# Patient Record
Sex: Male | Born: 1950
Health system: Southern US, Community
[De-identification: ages and names within clinical notes are randomized; demographics above are authoritative.]

## PROBLEM LIST (undated history)

## (undated) DIAGNOSIS — Z9109 Other allergy status, other than to drugs and biological substances: Secondary | ICD-10-CM

## (undated) DIAGNOSIS — R200 Anesthesia of skin: Secondary | ICD-10-CM

## (undated) DIAGNOSIS — K439 Ventral hernia without obstruction or gangrene: Secondary | ICD-10-CM

## (undated) DIAGNOSIS — I872 Venous insufficiency (chronic) (peripheral): Secondary | ICD-10-CM

## (undated) DIAGNOSIS — N400 Enlarged prostate without lower urinary tract symptoms: Secondary | ICD-10-CM

## (undated) DIAGNOSIS — M549 Dorsalgia, unspecified: Secondary | ICD-10-CM

## (undated) DIAGNOSIS — G8929 Other chronic pain: Secondary | ICD-10-CM

## (undated) DIAGNOSIS — M419 Scoliosis, unspecified: Secondary | ICD-10-CM

## (undated) DIAGNOSIS — K219 Gastro-esophageal reflux disease without esophagitis: Secondary | ICD-10-CM

## (undated) DIAGNOSIS — A159 Respiratory tuberculosis unspecified: Secondary | ICD-10-CM

## (undated) DIAGNOSIS — Z973 Presence of spectacles and contact lenses: Secondary | ICD-10-CM

## (undated) DIAGNOSIS — M1990 Unspecified osteoarthritis, unspecified site: Secondary | ICD-10-CM

## (undated) HISTORY — PX: EYE SURGERY: SHX253

## (undated) HISTORY — PX: HERNIA REPAIR: SHX51

---

## 2003-02-21 ENCOUNTER — Ambulatory Visit (HOSPITAL_BASED_OUTPATIENT_CLINIC_OR_DEPARTMENT_OTHER): Admission: RE | Admit: 2003-02-21 | Discharge: 2003-02-21 | Payer: Self-pay | Admitting: Surgery

## 2011-11-13 ENCOUNTER — Emergency Department: Payer: Self-pay | Admitting: *Deleted

## 2011-11-13 LAB — URINALYSIS, COMPLETE
Glucose,UR: NEGATIVE mg/dL (ref 0–75)
Ketone: NEGATIVE
Leukocyte Esterase: NEGATIVE
Nitrite: NEGATIVE
RBC,UR: 333 /HPF (ref 0–5)
Specific Gravity: 1.005 (ref 1.003–1.030)
WBC UR: NONE SEEN /HPF (ref 0–5)

## 2013-12-22 ENCOUNTER — Ambulatory Visit (INDEPENDENT_AMBULATORY_CARE_PROVIDER_SITE_OTHER): Payer: No Typology Code available for payment source | Admitting: Family Medicine

## 2013-12-22 VITALS — BP 122/84 | HR 73 | Temp 98.6°F | Resp 16 | Ht 71.5 in | Wt 182.0 lb

## 2013-12-22 DIAGNOSIS — S20229A Contusion of unspecified back wall of thorax, initial encounter: Secondary | ICD-10-CM

## 2013-12-22 MED ORDER — NAPROXEN 500 MG PO TABS: 500.0000 mg | ORAL_TABLET | Freq: Two times a day (BID) | ORAL | Status: DC

## 2013-12-22 NOTE — Progress Notes (Signed)
   Subjective:    Patient ID: Jordan Nelson, male    DOB: 12/06/1950, 63 y.o.   MRN: [id number]  HPI Patient presents with acute low back pain. Was soaking feet in tub and fell asleep and fell backwards against toilet and strained back. Pain currently on left side of lower back. No radicular pain or numbness. No hematuria. History of mild scoliosis but no other back problems. Tried a cold towel but no other treatment. No leg weakness. No B/B symptoms. No pleuritic pain  Review of Systems  All other systems reviewed and are negative.      Objective:   Physical Exam  Constitutional: He is oriented to person, place, and time. He appears well-developed and well-nourished. No distress.  HENT:  Head: Normocephalic and atraumatic.  Eyes: Conjunctivae are normal. Pupils are equal, round, and reactive to light. No scleral icterus.  Cardiovascular: Normal rate.   Pulmonary/Chest: Effort normal.  Musculoskeletal: Normal range of motion.  Neurological: He is alert and oriented to person, place, and time.  Skin: Skin is warm. He is not diaphoretic.  Psychiatric: He has a normal mood and affect. His behavior is normal.  Low back: FROM in flex, ext, lat bend, rotation. Scoliosis. Contusion over left flank. No midline TTP. No pain with deep breath. Normal 5/5 strength BLE. Reflexes trace BLE.     Assessment & Plan:  #1. Low back contusion/strain - Gentle ROM - naproxen bid x 2 weeks then prn - ice/heat - return precautions

## 2013-12-22 NOTE — Patient Instructions (Signed)
Thank you for coming in today  1. You have bruised and strained your back 2. I think that this will get better over several weeks 3. Use ice and heat for 10-15 minutes at a time. Use whatever feels better 4. Work on gentle stretching and activity 5. Call if you get worsening pain or blood in urine 6. Take naproxen twice a day for 2 weeks then as needed  Back Pain, Adult Low back pain is very common. About 1 in 5 people have back pain.The cause of low back pain is rarely dangerous. The pain often gets better over time.About half of people with a sudden onset of back pain feel better in just 2 weeks. About 8 in 10 people feel better by 6 weeks.  CAUSES Some common causes of back pain include:  Strain of the muscles or ligaments supporting the spine.  Wear and tear (degeneration) of the spinal discs.  Arthritis.  Direct injury to the back. DIAGNOSIS Most of the time, the direct cause of low back pain is not known.However, back pain can be treated effectively even when the exact cause of the pain is unknown.Answering your caregiver's questions about your overall health and symptoms is one of the most accurate ways to make sure the cause of your pain is not dangerous. If your caregiver needs more information, he or she may order lab work or imaging tests (X-rays or MRIs).However, even if imaging tests show changes in your back, this usually does not require surgery. HOME CARE INSTRUCTIONS For many people, back pain returns.Since low back pain is rarely dangerous, it is often a condition that people can learn to [facility] their own.   Remain active. It is stressful on the back to sit or stand in one place. Do not sit, drive, or stand in one place for more than 30 minutes at a time. Take short walks on level surfaces as soon as pain allows.Try to increase the length of time you walk each day.  Do not stay in bed.Resting more than 1 or 2 days can delay your recovery.  Do not avoid  exercise or work.Your body is made to move.It is not dangerous to be active, even though your back may hurt.Your back will likely heal faster if you return to being active before your pain is gone.  Pay attention to your body when you bend and lift. Many people have less discomfortwhen lifting if they bend their knees, keep the load close to their bodies,and avoid twisting. Often, the most comfortable positions are those that put less stress on your recovering back.  Find a comfortable position to sleep. Use a firm mattress and lie on your side with your knees slightly bent. If you lie on your back, put a pillow under your knees.  Only take over-the-counter or prescription medicines as directed by your caregiver. Over-the-counter medicines to reduce pain and inflammation are often the most helpful.Your caregiver may prescribe muscle relaxant drugs.These medicines help dull your pain so you can more quickly return to your normal activities and healthy exercise.  Put ice on the injured area.  Put ice in a plastic bag.  Place a towel between your skin and the bag.  Leave the ice on for 15-20 minutes, 03-04 times a day for the first 2 to 3 days. After that, ice and heat may be alternated to reduce pain and spasms.  Ask your caregiver about trying back exercises and gentle massage. This may be of some benefit.  Avoid  feeling anxious or stressed.Stress increases muscle tension and can worsen back pain.It is important to recognize when you are anxious or stressed and learn ways to manage it.Exercise is a great option. SEEK MEDICAL CARE IF:  You have pain that is not relieved with rest or medicine.  You have pain that does not improve in 1 week.  You have new symptoms.  You are generally not feeling well. SEEK IMMEDIATE MEDICAL CARE IF:   You have pain that radiates from your back into your legs.  You develop new bowel or bladder control problems.  You have unusual weakness or  numbness in your arms or legs.  You develop nausea or vomiting.  You develop abdominal pain.  You feel faint. Document Released: 07/29/2005 Document Revised: 01/28/2012 Document Reviewed: 12/17/2010 [facility] Patient Information 2014 [place].

## 2014-02-05 ENCOUNTER — Telehealth: Payer: Self-pay

## 2014-02-05 NOTE — Telephone Encounter (Signed)
Patient called wanting to know if Jordan SickleKatina had left a form for him in the pick up drawer. The form is ready and patient requests it to be mailed. Will mail form. Address correct in system.

## 2017-01-23 ENCOUNTER — Emergency Department (HOSPITAL_COMMUNITY): Payer: Medicare Other

## 2017-01-23 ENCOUNTER — Other Ambulatory Visit: Payer: Self-pay

## 2017-01-23 ENCOUNTER — Encounter (HOSPITAL_COMMUNITY): Payer: Self-pay | Admitting: *Deleted

## 2017-01-23 DIAGNOSIS — R0789 Other chest pain: Secondary | ICD-10-CM | POA: Insufficient documentation

## 2017-01-23 DIAGNOSIS — G8929 Other chronic pain: Secondary | ICD-10-CM | POA: Diagnosis not present

## 2017-01-23 DIAGNOSIS — M545 Low back pain: Secondary | ICD-10-CM | POA: Diagnosis not present

## 2017-01-23 DIAGNOSIS — F17200 Nicotine dependence, unspecified, uncomplicated: Secondary | ICD-10-CM | POA: Diagnosis not present

## 2017-01-23 NOTE — ED Triage Notes (Signed)
The pt is c/o lt mid-chest pain for 2 days especially when he presses down with his lt hand  No son no other problem

## 2017-01-24 ENCOUNTER — Emergency Department (HOSPITAL_COMMUNITY)
Admission: EM | Admit: 2017-01-24 | Discharge: 2017-01-24 | Disposition: A | Discharge status: Home / Self Care | Payer: Medicare Other | Attending: Emergency Medicine | Admitting: Emergency Medicine

## 2017-01-24 DIAGNOSIS — G8929 Other chronic pain: Secondary | ICD-10-CM

## 2017-01-24 DIAGNOSIS — M545 Low back pain: Secondary | ICD-10-CM

## 2017-01-24 DIAGNOSIS — R0789 Other chest pain: Secondary | ICD-10-CM

## 2017-01-24 LAB — BASIC METABOLIC PANEL
ANION GAP: 6 (ref 5–15)
BUN: 10 mg/dL (ref 6–20)
CALCIUM: 9.1 mg/dL (ref 8.9–10.3)
CO2: 25 mmol/L (ref 22–32)
CREATININE: 0.95 mg/dL (ref 0.61–1.24)
Chloride: 106 mmol/L (ref 101–111)
Glucose, Bld: 92 mg/dL (ref 65–99)
Potassium: 3.6 mmol/L (ref 3.5–5.1)
SODIUM: 137 mmol/L (ref 135–145)

## 2017-01-24 LAB — CBC
HCT: 37.1 % — ABNORMAL LOW (ref 39.0–52.0)
Hemoglobin: 12 g/dL — ABNORMAL LOW (ref 13.0–17.0)
MCH: 31 pg (ref 26.0–34.0)
MCHC: 32.3 g/dL (ref 30.0–36.0)
MCV: 95.9 fL (ref 78.0–100.0)
PLATELETS: 154 10*3/uL (ref 150–400)
RBC: 3.87 MIL/uL — ABNORMAL LOW (ref 4.22–5.81)
RDW: 13.2 % (ref 11.5–15.5)
WBC: 3.5 10*3/uL — ABNORMAL LOW (ref 4.0–10.5)

## 2017-01-24 LAB — TROPONIN I

## 2017-01-24 NOTE — Discharge Instructions (Signed)
Take 4 over the counter ibuprofen tablets 3 times a day or 2 over-the-counter naproxen tablets twice a day for pain. Also take tylenol 1000mg (2 extra strength) four times a day.    Discuss with [facility].

## 2017-01-24 NOTE — ED Provider Notes (Signed)
[facility]EM[facility] DEPT Provider Note   CSN: [id number] Arrival date & time: 01/23/17  2316  By signing my name below, I, Rosana Fret, attest that this documentation has been prepared under the direction and in the presence of Melene Plan, DO. Electronically Signed: Rosana Fret, ED Scribe. 01/24/17. 1:40 AM.  History   Chief Complaint Chief Complaint  Patient presents with  . Chest Pain   The history is provided by the patient. No language interpreter was used.  Chest Pain   Pertinent negatives include no abdominal pain, no diaphoresis, no fever, no headaches, no palpitations, no shortness of breath and no vomiting.   HPI Comments: Jordan Nelson is a 66 y.o. male who presents to the Emergency Department complaining of moderate, left-sided chest pain onset 2 days ago. Pt describes pain as a tightness sensation that occurs when he uses his left arm. Pt states pain is exacerbated by direct pressure and palpation. Pt reports pain is not worse with exertion. Per pt, he is a smoker. No FHx of CAD. No h/o CAD, HTN, HLD, DM PE/DVT, recent long travel, surgery, fracture, prolonged immobilization, or exogenous testosterone usage. Pt denies SOB, diaphoresis, vomiting or any other complaints at this time.  History reviewed. No pertinent past medical history.  There are no active problems to display for this patient.   History reviewed. No pertinent surgical history.     Home Medications    Prior to Admission medications   Medication Sig Start Date End Date Taking? Authorizing Provider  loratadine (CLARITIN) 10 MG tablet Take 10 mg by mouth daily.   Yes [provider]  naproxen (NAPROSYN) 500 MG tablet Take 1 tablet (500 mg total) by mouth 2 (two) times daily with a meal. Patient not taking: Reported on 01/24/2017 12/22/13   Daine Gip, MD    Family History No family history on file.  Social History Social History  Substance Use Topics  . Smoking status: Current Every  Day Smoker  . Smokeless tobacco: Never Used  . Alcohol use No     Allergies   Patient has no known allergies.   Review of Systems Review of Systems  Constitutional: Negative for chills, diaphoresis and fever.  HENT: Negative for congestion and facial swelling.   Eyes: Negative for discharge and visual disturbance.  Respiratory: Negative for shortness of breath.   Cardiovascular: Positive for chest pain. Negative for palpitations.  Gastrointestinal: Negative for abdominal pain, diarrhea and vomiting.  Musculoskeletal: Negative for arthralgias and myalgias.  Skin: Negative for color change and rash.  Neurological: Negative for tremors, syncope and headaches.  Psychiatric/Behavioral: Negative for confusion and dysphoric mood.     Physical Exam Updated Vital Signs BP (!) 128/93   Pulse (!) 57   Temp 98.2 F (36.8 C) (Oral)   Resp 19   Ht 6\' 3"  (1.905 m)   Wt 81.6 kg (180 lb)   SpO2 100%   BMI 22.50 kg/m   Physical Exam  Constitutional: He is oriented to person, place, and time. He appears well-developed and well-nourished.  HENT:  Head: Normocephalic and atraumatic.  Eyes: EOM are normal. Pupils are equal, round, and reactive to light.  Neck: Normal range of motion. Neck supple. No JVD present.  Cardiovascular: Normal rate, regular rhythm and normal heart sounds.  Exam reveals no gallop and no friction rub.   No murmur heard. Pulmonary/Chest: Effort normal and breath sounds normal. No respiratory distress. He has no wheezes. He has no rales.  Abdominal: He exhibits  no distension. There is no rebound and no guarding.  Musculoskeletal: Normal range of motion.  Neurological: He is alert and oriented to person, place, and time.  Skin: No rash noted. No pallor.  Psychiatric: He has a normal mood and affect. His behavior is normal.  Nursing note and vitals reviewed.    ED Treatments / Results  DIAGNOSTIC STUDIES: Oxygen Saturation is 100% on RA, normal by my  interpretation.   COORDINATION OF CARE: 1:38 AM-Discussed next steps with pt including a troponin test. Pt verbalized understanding and is agreeable with the plan.   Labs (all labs ordered are listed, but only abnormal results are displayed) Labs Reviewed  CBC - Abnormal; Notable for the following:       Result Value   WBC 3.5 (*)    RBC 3.87 (*)    Hemoglobin 12.0 (*)    HCT 37.1 (*)    All other components within normal limits  BASIC METABOLIC PANEL  TROPONIN I    EKG  EKG Interpretation  Date/Time:  Thursday January 23 2017 23:23:59 EDT Ventricular Rate:  69 PR Interval:  164 QRS Duration: 84 QT Interval:  380 QTC Calculation: 407 R Axis:   74 Text Interpretation:  Normal sinus rhythm Minimal voltage criteria for LVH, may be normal variant Septal infarct , age undetermined Abnormal ECG No significant change since last tracing Confirmed by Melene PlanFloyd, Lulamae Skorupski [phone]([id number]) on 01/24/2017 1:53:55 AM       Radiology Dg Chest 2 View  Result Date: 01/24/2017 CLINICAL DATA:  Chest pain with shortness of breath EXAM: CHEST  2 VIEW COMPARISON:  None. FINDINGS: Hyperinflation. No focal pulmonary infiltrate, consolidation, or pleural effusion. Cardiomediastinal silhouette within normal limits. No pneumothorax. Scoliosis of the spine. IMPRESSION: Hyperinflation.  No infiltrate or edema. Electronically Signed   By: Jasmine PangKim  Fujinaga M.D.   On: 01/24/2017 00:03    Procedures Procedures (including critical care time)  Medications Ordered in ED Medications - No data to display   Initial Impression / Assessment and Plan / ED Course  I have reviewed the triage vital signs and the nursing notes.  Pertinent labs & imaging results that were available during my care of the patient were reviewed by me and considered in my medical decision making (see chart for details).     66 yo M With a chief complaint of left-sided chest pain. Going on for the past 3 days. Occurs when he pushes his left arm in an  inferior position. Denies exertional symptoms. Denies shortness of breath. Muscular skeletal by history. Troponin negative. EKG chest x-ray unremarkable. Discharge home. 5:56 AM:  I have discussed the diagnosis/risks/treatment options with the patient and family and believe the pt to be eligible for discharge home to follow-up with PCP. We also discussed returning to the ED immediately if new or worsening sx occur. We discussed the sx which are most concerning (e.g., sudden worsening pain, fever, inability to tolerate by mouth) that necessitate immediate return. Medications administered to the patient during their visit and any new prescriptions provided to the patient are listed below.  Medications given during this visit Medications - No data to display   The patient appears reasonably screen and/or stabilized for discharge and I doubt any other medical condition or other [facility] requiring further screening, evaluation, or treatment in the ED at this time prior to discharge.    Final Clinical Impressions(s) / ED Diagnoses   Final diagnoses:  Atypical chest pain  Chronic bilateral low back pain without  sciatica    New Prescriptions Discharge Medication List as of 01/24/2017  1:55 AM      I personally performed the services described in this documentation, which was scribed in my presence. The recorded information has been reviewed and is accurate.     Melene Plan, DO 01/24/17 [phone]

## 2017-03-15 ENCOUNTER — Ambulatory Visit: Payer: No Typology Code available for payment source | Admitting: Physician Assistant

## 2017-03-15 DIAGNOSIS — K429 Umbilical hernia without obstruction or gangrene: Secondary | ICD-10-CM | POA: Diagnosis not present

## 2017-03-15 DIAGNOSIS — R030 Elevated blood-pressure reading, without diagnosis of hypertension: Secondary | ICD-10-CM | POA: Diagnosis not present

## 2017-04-02 ENCOUNTER — Ambulatory Visit: Payer: Self-pay | Admitting: General Surgery

## 2017-04-02 DIAGNOSIS — K439 Ventral hernia without obstruction or gangrene: Secondary | ICD-10-CM | POA: Diagnosis not present

## 2017-04-23 DIAGNOSIS — K429 Umbilical hernia without obstruction or gangrene: Secondary | ICD-10-CM | POA: Diagnosis not present

## 2017-05-06 ENCOUNTER — Encounter (HOSPITAL_COMMUNITY): Payer: Self-pay

## 2017-05-06 ENCOUNTER — Encounter (HOSPITAL_COMMUNITY)
Admission: RE | Admit: 2017-05-06 | Discharge: 2017-05-06 | Disposition: A | Discharge status: Home / Self Care | Payer: Medicare HMO | Source: Ambulatory Visit | Attending: General Surgery | Admitting: General Surgery

## 2017-05-06 DIAGNOSIS — Z8611 Personal history of tuberculosis: Secondary | ICD-10-CM | POA: Insufficient documentation

## 2017-05-06 DIAGNOSIS — K439 Ventral hernia without obstruction or gangrene: Secondary | ICD-10-CM | POA: Diagnosis not present

## 2017-05-06 DIAGNOSIS — K219 Gastro-esophageal reflux disease without esophagitis: Secondary | ICD-10-CM | POA: Insufficient documentation

## 2017-05-06 DIAGNOSIS — Z01812 Encounter for preprocedural laboratory examination: Secondary | ICD-10-CM | POA: Diagnosis not present

## 2017-05-06 DIAGNOSIS — Z87891 Personal history of nicotine dependence: Secondary | ICD-10-CM | POA: Insufficient documentation

## 2017-05-06 DIAGNOSIS — Z01818 Encounter for other preprocedural examination: Secondary | ICD-10-CM | POA: Diagnosis not present

## 2017-05-06 DIAGNOSIS — M1990 Unspecified osteoarthritis, unspecified site: Secondary | ICD-10-CM | POA: Insufficient documentation

## 2017-05-06 HISTORY — DX: Unspecified osteoarthritis, unspecified site: M19.90

## 2017-05-06 HISTORY — DX: Ventral hernia without obstruction or gangrene: K43.9

## 2017-05-06 HISTORY — DX: Presence of spectacles and contact lenses: Z97.3

## 2017-05-06 HISTORY — DX: Gastro-esophageal reflux disease without esophagitis: K21.9

## 2017-05-06 HISTORY — DX: Dorsalgia, unspecified: M54.9

## 2017-05-06 HISTORY — DX: Respiratory tuberculosis unspecified: A15.9

## 2017-05-06 HISTORY — DX: Other chronic pain: G89.29

## 2017-05-06 HISTORY — DX: Scoliosis, unspecified: M41.9

## 2017-05-06 HISTORY — DX: Anesthesia of skin: R20.0

## 2017-05-06 HISTORY — DX: Other allergy status, other than to drugs and biological substances: Z91.09

## 2017-05-06 LAB — BASIC METABOLIC PANEL
Anion gap: 5 (ref 5–15)
BUN: 11 mg/dL (ref 6–20)
CALCIUM: 9.2 mg/dL (ref 8.9–10.3)
CHLORIDE: 106 mmol/L (ref 101–111)
CO2: 27 mmol/L (ref 22–32)
CREATININE: 0.95 mg/dL (ref 0.61–1.24)
GFR calc Af Amer: >60 mL/min (ref >60)
GFR calc non Af Amer: >60 mL/min (ref >60)
Glucose, Bld: 92 mg/dL (ref 65–99)
Potassium: 4.2 mmol/L (ref 3.5–5.1)
Sodium: 138 mmol/L (ref 135–145)

## 2017-05-06 LAB — CBC
HEMATOCRIT: 37 % — AB (ref 39.0–52.0)
Hemoglobin: 12.3 g/dL — ABNORMAL LOW (ref 13.0–17.0)
MCH: 31.9 pg (ref 26.0–34.0)
MCHC: 33.2 g/dL (ref 30.0–36.0)
MCV: 95.9 fL (ref 78.0–100.0)
Platelets: 196 10*3/uL (ref 150–400)
RBC: 3.86 MIL/uL — ABNORMAL LOW (ref 4.22–5.81)
RDW: 13.4 % (ref 11.5–15.5)
WBC: 3.6 10*3/uL — AB (ref 4.0–10.5)

## 2017-05-06 NOTE — Progress Notes (Signed)
Pt denies SOB, chest pain, and being under the care of a cardiologist. Pt denies having a stress test, echo and cardiac cath. Pt denies recent labs. Pt chart forwarded to anesthesia to review EKG.

## 2017-05-06 NOTE — Pre-Procedure Instructions (Signed)
    Jordan Nelson  05/06/2017      [id number] [address] ([address]), [place] - [address] [place] ([place]) [place] [address] Phone: [phone] Fax: [phone]    Your procedure is scheduled on Monday, May 12, 2017  Report to [facility] Admitting at 11:30 A.M.  Call this number if you have problems the morning of surgery:  [phone]   Remember:  Do not eat food or drink liquids after midnight Sunday, May 11, 2017  Take these medicines the morning of surgery with A SIP OF WATER : if needed: diphenhydrAMINE (BENADRYL) for allergies Stop taking Aspirin, vitamins, fish oil and herbal medications. Do not take any NSAIDs ie: Ibuprofen, Advil, Naproxen (Aleve), Motrin, BC's, Goody Powder  and diclofenac sodium (VOLTAREN) 1 % GEL ; stop now.  Do not wear jewelry, make-up or nail polish.  Do not wear lotions, powders, or perfumes, or deoderant.  Do not shave 48 hours prior to surgery.  Men may shave face and neck.  Do not bring valuables to the hospital.  [facility] is not responsible for any belongings or valuables.  Contacts, dentures or bridgework may not be worn into surgery.  Leave your suitcase in the car.  After surgery it may be brought to your room. For patients admitted to the hospital, discharge time will be determined by your treatment team. Patients discharged the day of surgery will not be allowed to drive home.  Special instructions: Shower the night before surgery and the morning of surgery with CHG. Please read over the following fact sheets that you were given. Pain Booklet, Coughing and Deep Breathing and Surgical Site Infection Prevention

## 2017-05-07 ENCOUNTER — Encounter (HOSPITAL_COMMUNITY): Payer: Self-pay | Admitting: Vascular Surgery

## 2017-05-07 NOTE — Progress Notes (Signed)
Anesthesia Chart Review: Patient is a 66 year old male scheduled for repair of epigastric hernia with mesh on 05/12/17 by Dr. Violeta Gelinas.  History includes smoking, epigastric hernia, GERD, arthritis, TB 80's, scoliosis ("minor"), hernia repair. ED visit 01/24/17 for atypical, positional/reproducible chest pain (occurred when pushes his left arm in inferior position) with no exertional symptoms. Troponin was negative. Etiology felt to be musculoskeletal.   PCP is with [facility].  Meds include Flexeril, Benadryl, Vitamin D.   BP 128/81   Pulse 85   Temp 36.8 C   Resp 20   Ht  (1.905 m)   Wt 172 lb 9.6 oz (78.3 kg)   SpO2 100%   BMI 21.57 kg/m   EKG 01/23/17: NSR, minimal voltage criteria for LVH, may be normal variant, septal infarct (age undetermined). ED physician did not feel EKG had changed significantly since last tracing. I think poor anterior r wave progression is new when compared to 02/18/03 tracing in [facility]. Also appears to have at least a borderline repolarization abnormality on both tracings.   CXR 01/23/17: IMPRESSION: Hyperinflation.  No infiltrate or edema.  Preoperative labs noted. BMET WNL. WBC 3.6. H/H 12.3/37.0, PLT 196. Glucose 92.   He denied chest pain, SOB, prior stress, echo, or cath. If patient remains asymptomatic from a CV standpoint and otherwise no acute changes then I would anticipate that he could proceed as planned.  Velna Ochs [facility] Short Stay Center/Anesthesiology Phone [phone] 05/07/2017 11:13 AM

## 2017-05-12 ENCOUNTER — Ambulatory Visit (HOSPITAL_COMMUNITY): Admission: RE | Admit: 2017-05-12 | Payer: Medicare HMO | Source: Ambulatory Visit | Admitting: General Surgery

## 2017-05-12 ENCOUNTER — Encounter (HOSPITAL_COMMUNITY): Admission: RE | Payer: Self-pay | Source: Ambulatory Visit

## 2017-05-12 SURGERY — REPAIR, HERNIA, EPIGASTRIC, ADULT
Anesthesia: General

## 2017-05-16 ENCOUNTER — Emergency Department (HOSPITAL_COMMUNITY)
Admission: EM | Admit: 2017-05-16 | Discharge: 2017-05-17 | Disposition: A | Discharge status: Home / Self Care | Payer: Medicare HMO | Attending: Emergency Medicine | Admitting: Emergency Medicine

## 2017-05-16 ENCOUNTER — Encounter (HOSPITAL_COMMUNITY): Payer: Self-pay | Admitting: Emergency Medicine

## 2017-05-16 DIAGNOSIS — R69 Illness, unspecified: Secondary | ICD-10-CM | POA: Diagnosis not present

## 2017-05-16 DIAGNOSIS — F17210 Nicotine dependence, cigarettes, uncomplicated: Secondary | ICD-10-CM | POA: Insufficient documentation

## 2017-05-16 DIAGNOSIS — H43391 Other vitreous opacities, right eye: Secondary | ICD-10-CM | POA: Insufficient documentation

## 2017-05-16 NOTE — ED Triage Notes (Signed)
Reports "floaters" in right visual field for the last two days.  No other complaints.

## 2017-05-17 NOTE — ED Provider Notes (Signed)
[facility]PT Provider Note   CSN: [id number] Arrival date & time: 05/16/17  2315     History   Chief Complaint Chief Complaint  Patient presents with  . Eye Problem    HPI Jordan Nelson is a 66 y.o. male.  Patient is a 66 year old male with past mental history of arthritis, hiatal hernia presenting for evaluation of visual disturbances. He reports for the past 2 days he has had "floaters" in his right visual field. He denies any loss of visual acuity or visual field cuts. He denies any eye pain or pain of the soft tissues of the temple. He denies any injury or trauma.   The history is provided by the patient.  Eye Problem   This is a new problem. The current episode started 2 days ago. The problem occurs constantly. The problem has not changed since onset.There is a problem in the right eye. There was no injury mechanism. The patient is experiencing no pain. There is no history of trauma to the eye. Pertinent negatives include no numbness, no blurred vision, no decreased vision, no discharge, no double vision, no foreign body sensation, no photophobia, no eye redness, no nausea and no vomiting. He has tried nothing for the symptoms.    Past Medical History:  Diagnosis Date  . Arthritis   . Chronic back pain   . Epigastric hernia   . GERD (gastroesophageal reflux disease)   . Numbness    right hand  . Pollen allergy   . Scoliosis    " minor"  . Tuberculosis    early 1980's  . Wears glasses     There are no active problems to display for this patient.   Past Surgical History:  Procedure Laterality Date  . EYE SURGERY    . HERNIA REPAIR         Home Medications    Prior to Admission medications   Medication Sig Start Date End Date Taking? Authorizing Provider  cyclobenzaprine (FLEXERIL) 10 MG tablet Take 10 mg by mouth 3 (three) times daily.   Yes [provider]  diclofenac sodium (VOLTAREN) 1 % GEL Apply 1 application topically 3 (three)  times daily.   Yes [provider]  Vitamin D, Ergocalciferol, (DRISDOL) 50000 units CAPS capsule Take 50,000 Units by mouth every 30 (thirty) days.    Yes [provider]    Family History Family History  Problem Relation Age of Onset  . Hypertension Mother     Social History Social History  Substance Use Topics  . Smoking status: Current Some Day Smoker    Types: Cigarettes  . Smokeless tobacco: Never Used  . Alcohol use No     Allergies   Patient has no known allergies.   Review of Systems Review of Systems  Eyes: Negative for blurred vision, double vision, photophobia, discharge and redness.  Gastrointestinal: Negative for nausea and vomiting.  Neurological: Negative for numbness.  All other systems reviewed and are negative.    Physical Exam Updated Vital Signs BP 127/90 (BP Location: Right Arm)   Pulse 78   Temp 97.9 F (36.6 C) (Oral)   Resp 20   Ht  (1.905 m)   Wt 78 kg (172 lb)   SpO2 99%   BMI 21.50 kg/m   Physical Exam  Constitutional: He is oriented to person, place, and time. He appears well-developed and well-nourished. No distress.  HENT:  Head: Normocephalic and atraumatic.  Eyes: Pupils are equal, round, and  reactive to light. EOM are normal.  The right eye appears grossly normal. There are no obvious abrasions or conjunctival injection. The pupil is reactive. Funduscopic exam is somewhat limited, however reveals no obvious abnormality.  Neck: Normal range of motion. Neck supple.  Neurological: He is alert and oriented to person, place, and time.  Skin: Skin is warm and dry. He is not diaphoretic.  Nursing note and vitals reviewed.    ED Treatments / Results  Labs (all labs ordered are listed, but only abnormal results are displayed) Labs Reviewed - No data to display  EKG  EKG Interpretation None       Radiology No results found.  Procedures Procedures (including critical care time)  Medications  Ordered in ED Medications - No data to display   Initial Impression / Assessment and Plan / ED Course  I have reviewed the triage vital signs and the nursing notes.  Pertinent labs & imaging results that were available during my care of the patient were reviewed by me and considered in my medical decision making (see chart for details).  Patient presents with complaints of floaters in his right visual field. To my exam, his eye appears normal. I've discussed this with Dr. Allena Katz from ophthalmology who will make arrangements for patient to be followed up tomorrow.  Final Clinical Impressions(s) / ED Diagnoses   Final diagnoses:  None    New Prescriptions New Prescriptions   No medications on file     Geoffery Lyons, MD 05/17/17 [phone]

## 2017-05-17 NOTE — Discharge Instructions (Signed)
Dr. Allena Katz from ophthalmology will call you tomorrow to make arrangements for follow-up either this weekend or Monday depending on whether or not your symptoms worsen.  Return to the emergency department if your symptoms significantly worsen or change.

## 2017-05-19 ENCOUNTER — Ambulatory Visit: Payer: No Typology Code available for payment source | Admitting: Family Medicine

## 2017-05-19 DIAGNOSIS — H43391 Other vitreous opacities, right eye: Secondary | ICD-10-CM | POA: Diagnosis not present

## 2017-05-19 DIAGNOSIS — H43811 Vitreous degeneration, right eye: Secondary | ICD-10-CM | POA: Diagnosis not present

## 2017-06-02 DIAGNOSIS — H43391 Other vitreous opacities, right eye: Secondary | ICD-10-CM | POA: Diagnosis not present

## 2017-06-02 DIAGNOSIS — H43311 Vitreous membranes and strands, right eye: Secondary | ICD-10-CM | POA: Diagnosis not present

## 2017-06-02 DIAGNOSIS — H43811 Vitreous degeneration, right eye: Secondary | ICD-10-CM | POA: Diagnosis not present

## 2017-07-14 DIAGNOSIS — H43811 Vitreous degeneration, right eye: Secondary | ICD-10-CM | POA: Diagnosis not present

## 2017-07-14 DIAGNOSIS — H5371 Glare sensitivity: Secondary | ICD-10-CM | POA: Diagnosis not present

## 2017-07-14 DIAGNOSIS — H35411 Lattice degeneration of retina, right eye: Secondary | ICD-10-CM | POA: Diagnosis not present

## 2017-07-14 DIAGNOSIS — H43311 Vitreous membranes and strands, right eye: Secondary | ICD-10-CM | POA: Diagnosis not present

## 2017-09-15 DIAGNOSIS — H35412 Lattice degeneration of retina, left eye: Secondary | ICD-10-CM | POA: Diagnosis not present

## 2017-09-15 DIAGNOSIS — H43391 Other vitreous opacities, right eye: Secondary | ICD-10-CM | POA: Diagnosis not present

## 2017-09-15 DIAGNOSIS — H43811 Vitreous degeneration, right eye: Secondary | ICD-10-CM | POA: Diagnosis not present

## 2017-09-15 DIAGNOSIS — H35411 Lattice degeneration of retina, right eye: Secondary | ICD-10-CM | POA: Diagnosis not present

## 2017-10-22 ENCOUNTER — Encounter (HOSPITAL_COMMUNITY): Payer: Self-pay | Admitting: Emergency Medicine

## 2017-10-22 ENCOUNTER — Emergency Department (HOSPITAL_COMMUNITY)
Admission: EM | Admit: 2017-10-22 | Discharge: 2017-10-23 | Disposition: A | Discharge status: Home / Self Care | Payer: Medicare HMO | Attending: Emergency Medicine | Admitting: Emergency Medicine

## 2017-10-22 ENCOUNTER — Other Ambulatory Visit: Payer: Self-pay

## 2017-10-22 DIAGNOSIS — Y9389 Activity, other specified: Secondary | ICD-10-CM | POA: Diagnosis not present

## 2017-10-22 DIAGNOSIS — F17210 Nicotine dependence, cigarettes, uncomplicated: Secondary | ICD-10-CM | POA: Diagnosis not present

## 2017-10-22 DIAGNOSIS — K429 Umbilical hernia without obstruction or gangrene: Secondary | ICD-10-CM | POA: Insufficient documentation

## 2017-10-22 DIAGNOSIS — R109 Unspecified abdominal pain: Secondary | ICD-10-CM | POA: Diagnosis not present

## 2017-10-22 DIAGNOSIS — R69 Illness, unspecified: Secondary | ICD-10-CM | POA: Diagnosis not present

## 2017-10-22 DIAGNOSIS — Z79899 Other long term (current) drug therapy: Secondary | ICD-10-CM | POA: Diagnosis not present

## 2017-10-22 DIAGNOSIS — R1033 Periumbilical pain: Secondary | ICD-10-CM | POA: Diagnosis present

## 2017-10-22 DIAGNOSIS — X500XXA Overexertion from strenuous movement or load, initial encounter: Secondary | ICD-10-CM | POA: Insufficient documentation

## 2017-10-22 LAB — URINALYSIS, ROUTINE W REFLEX MICROSCOPIC
BILIRUBIN URINE: NEGATIVE
GLUCOSE, UA: NEGATIVE mg/dL
Hgb urine dipstick: NEGATIVE
Ketones, ur: 5 mg/dL — AB
Leukocytes, UA: NEGATIVE
NITRITE: NEGATIVE
PH: 6 (ref 5.0–8.0)
Protein, ur: NEGATIVE mg/dL
SPECIFIC GRAVITY, URINE: 1.021 (ref 1.005–1.030)

## 2017-10-22 LAB — COMPREHENSIVE METABOLIC PANEL
ALBUMIN: 3.7 g/dL (ref 3.5–5.0)
ALT: 32 U/L (ref 17–63)
ANION GAP: 9 (ref 5–15)
AST: 47 U/L — ABNORMAL HIGH (ref 15–41)
Alkaline Phosphatase: 49 U/L (ref 38–126)
BUN: 12 mg/dL (ref 6–20)
CHLORIDE: 105 mmol/L (ref 101–111)
CO2: 24 mmol/L (ref 22–32)
Calcium: 9.2 mg/dL (ref 8.9–10.3)
Creatinine, Ser: 0.96 mg/dL (ref 0.61–1.24)
GFR calc Af Amer: >60 mL/min (ref >60)
GFR calc non Af Amer: >60 mL/min (ref >60)
GLUCOSE: 91 mg/dL (ref 65–99)
POTASSIUM: 3.7 mmol/L (ref 3.5–5.1)
Sodium: 138 mmol/L (ref 135–145)
Total Bilirubin: 0.7 mg/dL (ref 0.3–1.2)
Total Protein: 7.5 g/dL (ref 6.5–8.1)

## 2017-10-22 LAB — CBC
HEMATOCRIT: 35.2 % — AB (ref 39.0–52.0)
HEMOGLOBIN: 11.8 g/dL — AB (ref 13.0–17.0)
MCH: 32.1 pg (ref 26.0–34.0)
MCHC: 33.5 g/dL (ref 30.0–36.0)
MCV: 95.7 fL (ref 78.0–100.0)
Platelets: 151 10*3/uL (ref 150–400)
RBC: 3.68 MIL/uL — ABNORMAL LOW (ref 4.22–5.81)
RDW: 13.1 % (ref 11.5–15.5)
WBC: 3 10*3/uL — ABNORMAL LOW (ref 4.0–10.5)

## 2017-10-22 LAB — LIPASE, BLOOD: Lipase: 20 U/L (ref 11–51)

## 2017-10-22 NOTE — ED Triage Notes (Signed)
Pt has known hernia, c/o pain and states it has grown. LBM yesterday. Denies nausea/vomiting.

## 2017-10-22 NOTE — ED Provider Notes (Signed)
[facility] EMERGENCY DEPARTMENT Provider Note   CSN: [id number] Arrival date & time: 10/22/17  1739     History   Chief Complaint Chief Complaint  Patient presents with  . Hernia    HPI Jordan Nelson is a 67 y.o. male.  Patient with past medical history remarkable for umbilical hernia presents to the emergency department with chief complaint of worsening abdominal pain.  He states the symptoms worsen today.  He states that he had been doing some heavy lifting while moving recently.  He denies any nausea, vomiting, diarrhea.  States last normal bowel movement was yesterday.  Denies any fevers.  States pain is moderate.  It is worsened with palpation.   The history is provided by the patient. No language interpreter was used.    Past Medical History:  Diagnosis Date  . Arthritis   . Chronic back pain   . Epigastric hernia   . GERD (gastroesophageal reflux disease)   . Numbness    right hand  . Pollen allergy   . Scoliosis    " minor"  . Tuberculosis    early 1980's  . Wears glasses     There are no active problems to display for this patient.   Past Surgical History:  Procedure Laterality Date  . EYE SURGERY    . HERNIA REPAIR         Home Medications    Prior to Admission medications   Medication Sig Start Date End Date Taking? Authorizing Provider  cyclobenzaprine (FLEXERIL) 10 MG tablet Take 10 mg by mouth 3 (three) times daily.    [provider]  diclofenac sodium (VOLTAREN) 1 % GEL Apply 1 application topically 3 (three) times daily.    [provider]  Vitamin D, Ergocalciferol, (DRISDOL) 50000 units CAPS capsule Take 50,000 Units by mouth every 30 (thirty) days.     [provider]    Family History Family History  Problem Relation Age of Onset  . Hypertension Mother     Social History Social History   Tobacco Use  . Smoking status: Current Some Day Smoker    Types: Cigarettes  . Smokeless  tobacco: Never Used  Substance Use Topics  . Alcohol use: No  . Drug use: No     Allergies   Patient has no known allergies.   Review of Systems Review of Systems  All other systems reviewed and are negative.    Physical Exam Updated Vital Signs BP (!) 152/94 (BP Location: Right Arm)   Pulse 77   Temp 98.4 F (36.9 C) (Oral)   Resp 14   SpO2 99%   Physical Exam  Constitutional: He is oriented to person, place, and time. He appears well-developed and well-nourished.  HENT:  Head: Normocephalic and atraumatic.  Eyes: Conjunctivae and EOM are normal.  Neck: Normal range of motion.  Cardiovascular: Normal rate.  Pulmonary/Chest: Effort normal.  Abdominal: Bowel sounds are normal. He exhibits no distension. A hernia is present.  Positive bowel sounds in all 4 quadrants, 3 x 3 cm periumbilical hernia  Musculoskeletal: Normal range of motion.  Neurological: He is alert and oriented to person, place, and time.  Skin: Skin is dry.  Psychiatric: He has a normal mood and affect. His behavior is normal. Judgment and thought content normal.  Nursing note and vitals reviewed.    ED Treatments / Results  Labs (all labs ordered are listed, but only abnormal results are displayed) Labs Reviewed  COMPREHENSIVE  METABOLIC PANEL - Abnormal; Notable for the following components:      Result Value   AST 47 (*)    All other components within normal limits  CBC - Abnormal; Notable for the following components:   WBC 3.0 (*)    RBC 3.68 (*)    Hemoglobin 11.8 (*)    HCT 35.2 (*)    All other components within normal limits  URINALYSIS, ROUTINE W REFLEX MICROSCOPIC - Abnormal; Notable for the following components:   Ketones, ur 5 (*)    All other components within normal limits  LIPASE, BLOOD    EKG  EKG Interpretation None       Radiology Ct Abdomen Pelvis W Contrast  Result Date: 10/23/2017 CLINICAL DATA:  67 year old male with abdominal pain.  Known hernia. EXAM: CT  ABDOMEN AND PELVIS WITH CONTRAST TECHNIQUE: Multidetector CT imaging of the abdomen and pelvis was performed using the standard protocol following bolus administration of intravenous contrast. CONTRAST:  ISOVUE-300 IOPAMIDOL (ISOVUE-300) INJECTION 61% COMPARISON:  Abdominal CT dated 11/13/2011 FINDINGS: Lower chest: The visualized lung bases are clear. No intra-abdominal free air or free fluid. Hepatobiliary: No focal liver abnormality is seen. No gallstones, gallbladder wall thickening, or biliary dilatation. Pancreas: Unremarkable. No pancreatic ductal dilatation or surrounding inflammatory changes. Spleen: Normal in size without focal abnormality. Adrenals/Urinary Tract: Adrenal glands are unremarkable. Kidneys are normal, without renal calculi, focal lesion, or hydronephrosis. Bladder is unremarkable. Stomach/Bowel: Normal caliber fluid-filled loops of small bowel, likely physiologic. Enteritis is less likely. Clinical correlation is recommended. There is moderate stool throughout the colon. There is no bowel obstruction or active inflammation. The appendix is normal. Vascular/Lymphatic: There is mild aortoiliac atherosclerotic disease. No portal venous gas. There is no adenopathy. Reproductive: Enlarged prostate gland measuring approximately 6 cm in transverse diameter. Other: Small fat containing supraumbilical hernia with small amount of fluid may represent a degree of inflammation. Correlation with clinical exam recommended. Musculoskeletal: Degenerative changes of the spine and scoliosis. There is loss of subcutaneous fat and cachexia. IMPRESSION: 1. Fluid-filled loops of small bowel, likely physiologic. Clinical correlation is recommended. No bowel obstruction. Normal appendix. 2. Small supraumbilical fat containing hernia. Small amount of fluid within this hernia may represent inflammatory changes. Clinical correlation is recommended. 3. Other nonemergent and nonacute findings as above.  Electronically Signed   By: Elgie Collard M.D.   On: 10/23/2017 01:21    Procedures Hernia reduction Date/Time: 10/22/2017 11:48 PM Performed by: Roxy Horseman, [name] Authorized by: Roxy Horseman, [name]  Consent: Verbal consent obtained. Risks and benefits: risks, benefits and alternatives were discussed Consent given by: patient Patient understanding: patient states understanding of the procedure being performed Patient consent: the patient's understanding of the procedure matches consent given Procedure consent: procedure consent matches procedure scheduled Relevant documents: relevant documents present and verified Test results: test results available and properly labeled Site marked: the operative site was marked Imaging studies: imaging studies available Required items: required blood products, implants, devices, and special equipment available Patient identity confirmed: verbally with patient Time out: Immediately prior to procedure a "time out" was called to verify the correct patient, procedure, equipment, support staff and site/side marked as required. Preparation: Patient was prepped and draped in the usual sterile fashion. Local anesthesia used: no  Anesthesia: Local anesthesia used: no  Sedation: Patient sedated: no  Patient tolerance: Patient tolerated the procedure well with no immediate complications Comments: Good pain relief with reduction of the umbilical hernia    (including critical care time)  Medications Ordered in ED Medications - No data to display   Initial Impression / Assessment and Plan / ED Course  I have reviewed the triage vital signs and the nursing notes.  Pertinent labs & imaging results that were available during my care of the patient were reviewed by me and considered in my medical decision making (see chart for details).     CT scan shows no evidence of bowel obstruction or incarcerated/strangulated hernia.  There is some  inflammatory changes of the adipose tissue.  There are no outward skin changes.  Patient feels much better after reduction of the umbilical hernia.  He is in no acute distress.  He has bowel sounds present in all 4 quadrants.  He has follow-up with general surgery in 2 weeks.  Return precautions discussed.  Patient is stable for discharge.  Final Clinical Impressions(s) / ED Diagnoses   Final diagnoses:  Periumbilical hernia    ED Discharge Orders    None       Roxy HorsemanBrowning, Shamari Trostel, [name] 10/23/17 [id number]    Gilda CreasePollina, Christopher J, MD 10/23/17 [phone]

## 2017-10-23 ENCOUNTER — Emergency Department (HOSPITAL_COMMUNITY): Payer: Medicare HMO

## 2017-10-23 DIAGNOSIS — X500XXA Overexertion from strenuous movement or load, initial encounter: Secondary | ICD-10-CM | POA: Diagnosis not present

## 2017-10-23 DIAGNOSIS — K429 Umbilical hernia without obstruction or gangrene: Secondary | ICD-10-CM | POA: Diagnosis not present

## 2017-10-23 DIAGNOSIS — Z79899 Other long term (current) drug therapy: Secondary | ICD-10-CM | POA: Diagnosis not present

## 2017-10-23 DIAGNOSIS — R69 Illness, unspecified: Secondary | ICD-10-CM | POA: Diagnosis not present

## 2017-10-23 DIAGNOSIS — R109 Unspecified abdominal pain: Secondary | ICD-10-CM | POA: Diagnosis not present

## 2017-10-23 MED ORDER — IOPAMIDOL (ISOVUE-300) INJECTION 61%
100.0000 mL | Freq: Once | INTRAVENOUS | Status: AC | PRN
  Administered 2017-10-23: 100 mL via INTRAVENOUS

## 2017-10-23 NOTE — ED Notes (Signed)
Taken to CT at this time. 

## 2018-03-11 DIAGNOSIS — R69 Illness, unspecified: Secondary | ICD-10-CM | POA: Diagnosis not present

## 2018-05-04 DIAGNOSIS — R69 Illness, unspecified: Secondary | ICD-10-CM | POA: Diagnosis not present

## 2018-06-04 IMAGING — CT CT ABD-PELV W/ CM
2 of 5 series · 16 of 46 positions shown, 18 images · IV contrast (APPLIED)
Comparison: Abdominal CT dated 11/13/2011

CLINICAL DATA: 66-year-old male with abdominal pain.  Known hernia.

EXAM:
CT ABDOMEN AND PELVIS WITH CONTRAST
TECHNIQUE: Multidetector CT imaging of the abdomen and pelvis was performed
using the standard protocol following bolus administration of
intravenous contrast.
CONTRAST:  100mL KB2KF5-388 IOPAMIDOL ([id number]) INJECTION 61%

[Series 3: abd/ pelvis 5.0 i30f 2 · axial · 0.85mm/px · z∈[+1049,+1399]mm · 13 of 80 slices shown, 15 images]
[im 5/80  soft-tissue]
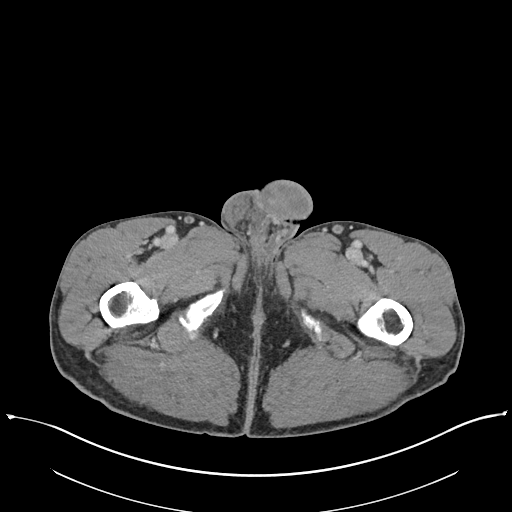
[im 5/80  bone]
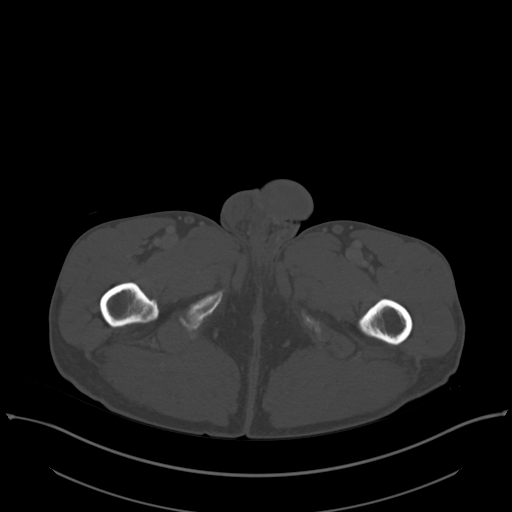
[im 13/80  soft-tissue]
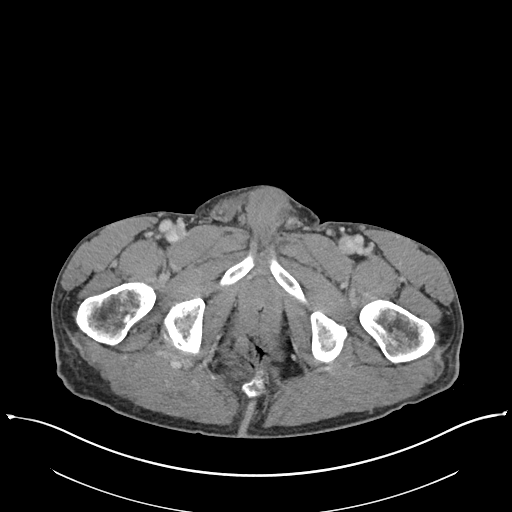
[im 17/80  soft-tissue]
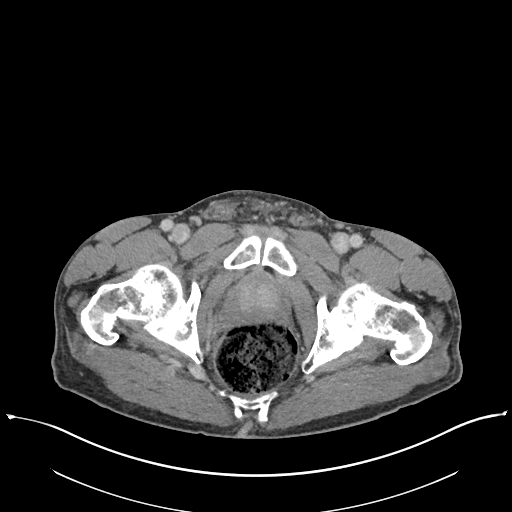
[im 21/80  soft-tissue]
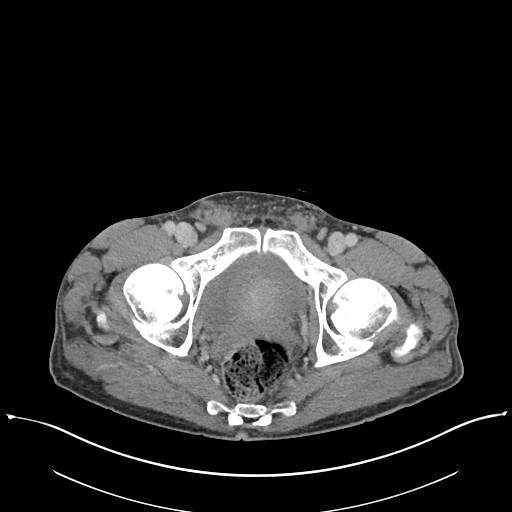
[im 30/80  soft-tissue]
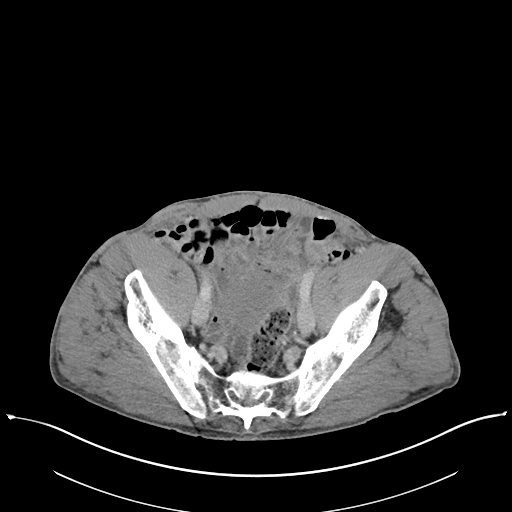
[im 34/80  soft-tissue]
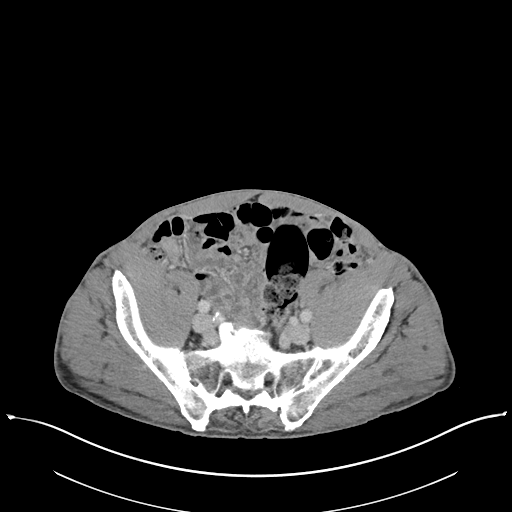
[im 42/80  soft-tissue]
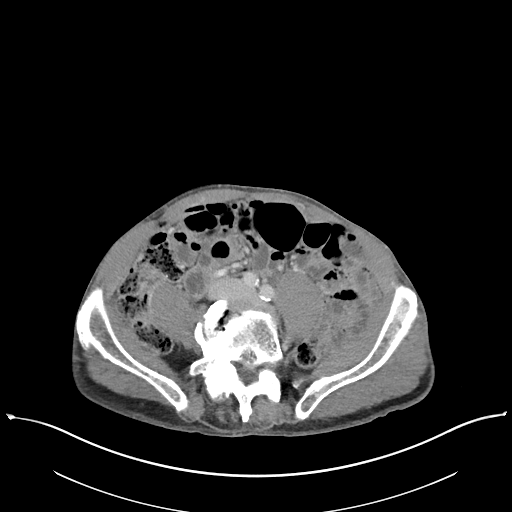
[im 46/80  soft-tissue]
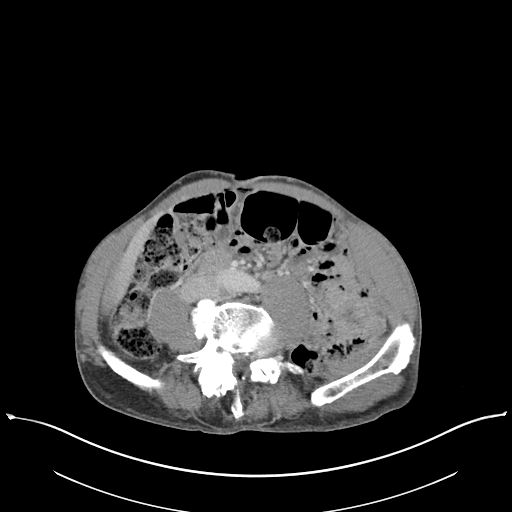
[im 50/80  soft-tissue]
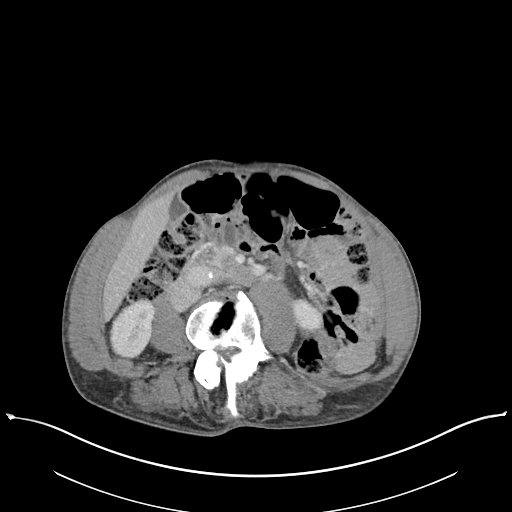
[im 50/80  bone]
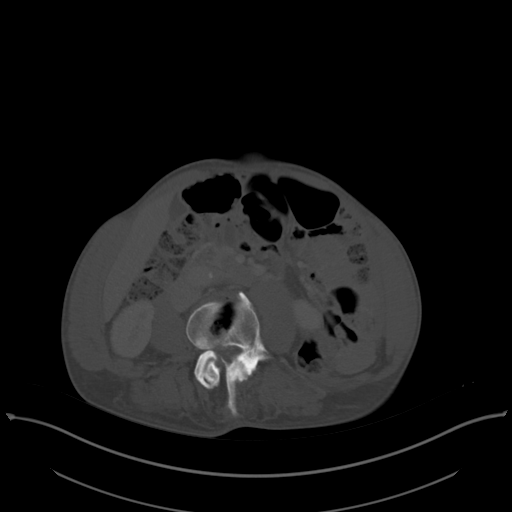
[im 59/80  soft-tissue]
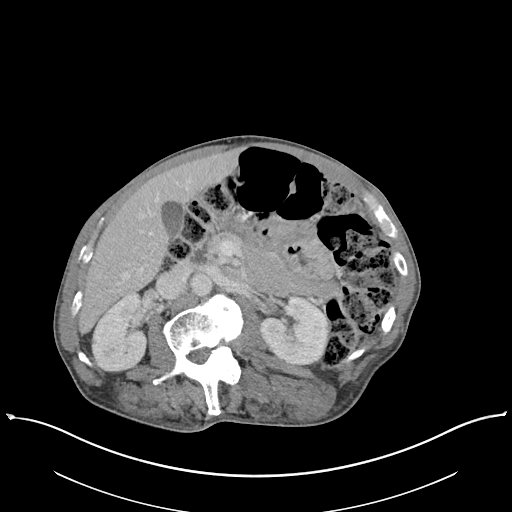
[im 63/80  soft-tissue]
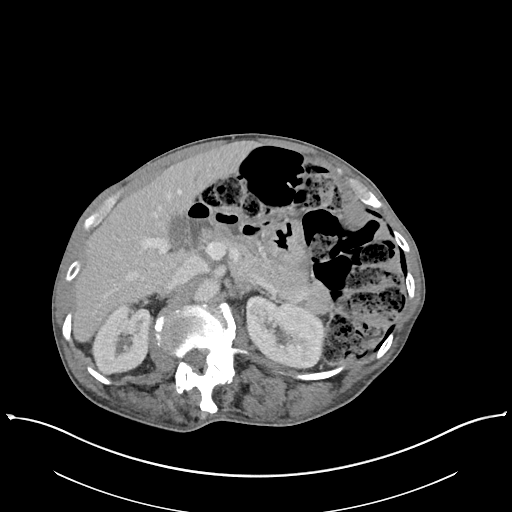
[im 67/80  soft-tissue]
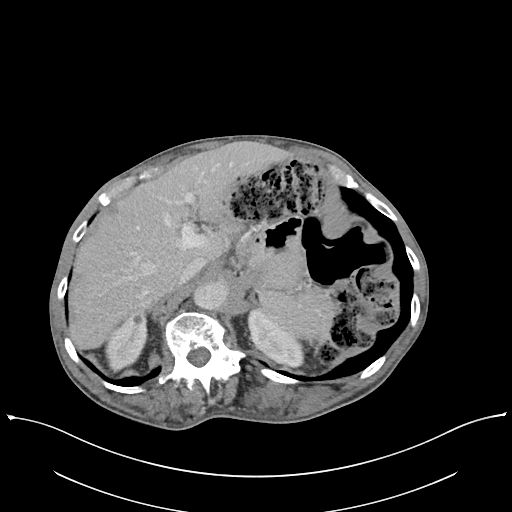
[im 75/80  soft-tissue]
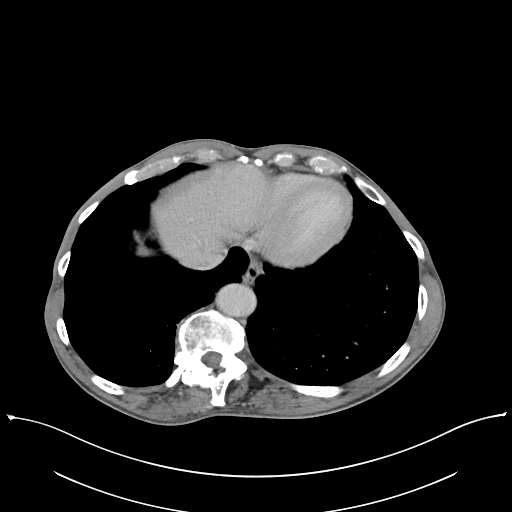

[Series 6: coronal soft tissue · coronal · 0.71mm/px · 3 of 87 slices shown]
[im 29/87  soft-tissue]
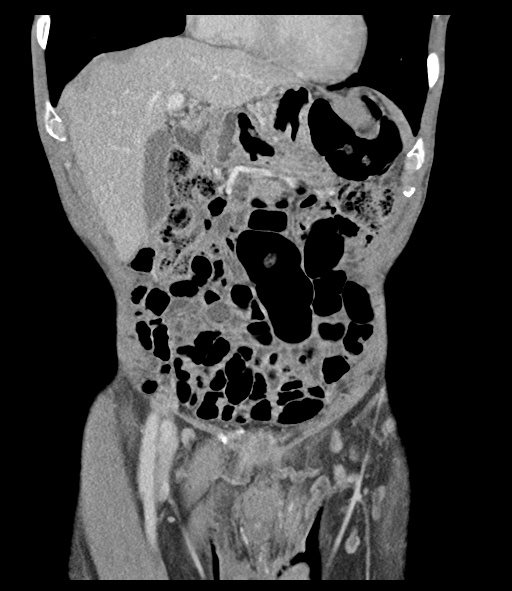
[im 39/87  soft-tissue]
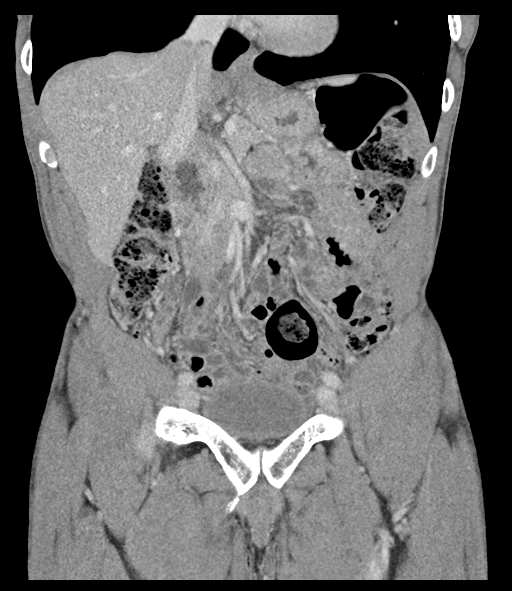
[im 48/87  soft-tissue]
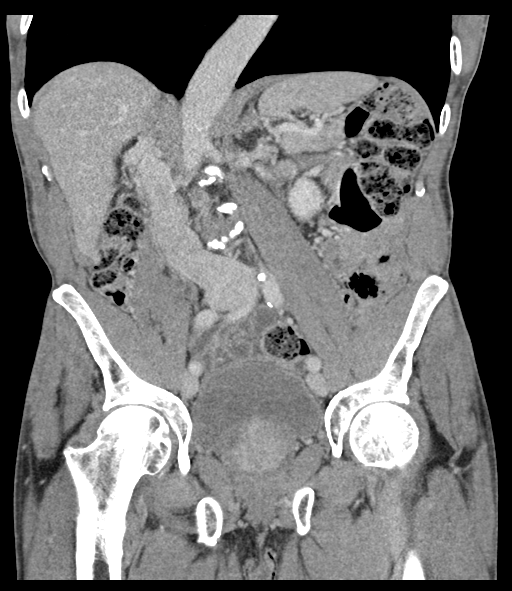

[16 of 46 positions shown; findings below may reference images not displayed]

FINDINGS: Lower chest: The visualized lung bases are clear.

No intra-abdominal free air or free fluid.

Hepatobiliary: No focal liver abnormality is seen. No gallstones,
gallbladder wall thickening, or biliary dilatation.

Pancreas: Unremarkable. No pancreatic ductal dilatation or
surrounding inflammatory changes.

Spleen: Normal in size without focal abnormality.

Adrenals/Urinary Tract: Adrenal glands are unremarkable. Kidneys are
normal, without renal calculi, focal lesion, or hydronephrosis.
Bladder is unremarkable.

Stomach/Bowel: Normal caliber fluid-filled loops of small bowel,
likely physiologic. Enteritis is less likely. Clinical correlation
is recommended. There is moderate stool throughout the colon. There
is no bowel obstruction or active inflammation. The appendix is
normal.

Vascular/Lymphatic: There is mild aortoiliac atherosclerotic
disease. No portal venous gas. There is no adenopathy.

Reproductive: Enlarged prostate gland measuring approximately 6 cm
in transverse diameter.

Other: Small fat containing supraumbilical hernia with small amount
of fluid may represent a degree of inflammation. Correlation with
clinical exam recommended.

Musculoskeletal: Degenerative changes of the spine and scoliosis.
There is loss of subcutaneous fat and cachexia.
IMPRESSION: 1. Fluid-filled loops of small bowel, likely physiologic. Clinical
correlation is recommended. No bowel obstruction. Normal appendix.
2. Small supraumbilical fat containing hernia. Small amount of fluid
within this hernia may represent inflammatory changes. Clinical
correlation is recommended.
3. Other nonemergent and nonacute findings as above.

## 2018-06-11 ENCOUNTER — Ambulatory Visit (HOSPITAL_COMMUNITY)
Admission: EM | Admit: 2018-06-11 | Discharge: 2018-06-11 | Disposition: A | Discharge status: Home / Self Care | Payer: Medicare HMO | Attending: Family Medicine | Admitting: Family Medicine

## 2018-06-11 ENCOUNTER — Encounter (HOSPITAL_COMMUNITY): Payer: Self-pay | Admitting: *Deleted

## 2018-06-11 DIAGNOSIS — K0889 Other specified disorders of teeth and supporting structures: Secondary | ICD-10-CM | POA: Diagnosis not present

## 2018-06-11 DIAGNOSIS — K047 Periapical abscess without sinus: Secondary | ICD-10-CM

## 2018-06-11 MED ORDER — AMOXICILLIN-POT CLAVULANATE 875-125 MG PO TABS: 1.0000 | ORAL_TABLET | Freq: Two times a day (BID) | ORAL | 0 refills | Status: AC

## 2018-06-11 MED ORDER — NAPROXEN 375 MG PO TABS: 375.0000 mg | ORAL_TABLET | Freq: Two times a day (BID) | ORAL | 0 refills | Status: DC | PRN

## 2018-06-11 NOTE — Discharge Instructions (Signed)
Use naproxen as needed for pain Augmentin prescribed.  Use as directed and to completion Recommend soft diet until evaluated by dentist Maintain oral hygiene care Follow up with dentist as soon as possible for further evaluation and treatment

## 2018-06-11 NOTE — ED Provider Notes (Signed)
[facility]   [id number] 06/11/18 Arrival Time: 1752  CC: DENTAL PAIN  SUBJECTIVE:  Jordan Nelson is a 67 y.o. male who reports gradual onset of right lower dental pain that began yesterday. Symptoms began after chewing on his right side. Describes as constant and not painful, but uncomfortable. Worse with chewing. Has not tried OTC medications. States he is going to have a dental procedure in the near future. Complains of associated LAD.  Denies fever, chills, nausea, vomiting, neck swelling, throat swelling, tongue swelling, difficulty or swallowing.    ROS: As per HPI.  Past Medical History:  Diagnosis Date  . Arthritis   . Chronic back pain   . Epigastric hernia   . GERD (gastroesophageal reflux disease)   . Numbness    right hand  . Pollen allergy   . Scoliosis    " minor"  . Tuberculosis    early 1980's  . Wears glasses    Past Surgical History:  Procedure Laterality Date  . EYE SURGERY    . HERNIA REPAIR     No Known Allergies No current facility-administered medications on file prior to encounter.    No current outpatient medications on file prior to encounter.   Social History   Socioeconomic History  . Marital status: Single    Spouse name: Not on file  . Number of children: Not on file  . Years of education: Not on file  . Highest education level: Not on file  Occupational History  . Not on file  Social Needs  . Financial resource strain: Not on file  . Food insecurity:    Worry: Not on file    Inability: Not on file  . Transportation needs:    Medical: Not on file    Non-medical: Not on file  Tobacco Use  . Smoking status: Former Smoker    Types: Cigarettes  . Smokeless tobacco: Never Used  Substance and Sexual Activity  . Alcohol use: No  . Drug use: No  . Sexual activity: Not on file  Lifestyle  . Physical activity:    Days per week: Not on file    Minutes per session: Not on file  . Stress: Not on file  Relationships    . Social connections:    Talks on phone: Not on file    Gets together: Not on file    Attends religious service: Not on file    Active member of club or organization: Not on file    Attends meetings of clubs or organizations: Not on file    Relationship status: Not on file  . Intimate partner violence:    Fear of current or ex partner: Not on file    Emotionally abused: Not on file    Physically abused: Not on file    Forced sexual activity: Not on file  Other Topics Concern  . Not on file  Social History Narrative  . Not on file   Family History  Problem Relation Age of Onset  . Hypertension Mother     OBJECTIVE:  Vitals:   06/11/18 1820  BP: 127/86  Pulse: 66  Resp: 16  Temp: 98.3 F (36.8 C)  TempSrc: Oral  SpO2: 98%    General appearance: alert; no distress HENT: normocephalic; atraumatic; dentition: fair - poor; poor over right lower gums without areas of fluctuance; dental caries present Neck: right sided submandibular lymph node; freely moveable; nontender to palpation  CV: RRR Lungs: normal respirations Skin: warm  and dry Psychological: alert and cooperative; normal mood and affect  ASSESSMENT & PLAN:  1. Pain, dental   2. Dental infection     Meds ordered this encounter  Medications  . amoxicillin-clavulanate (AUGMENTIN) 875-125 MG tablet    Sig: Take 1 tablet by mouth every 12 (twelve) hours for 10 days.    Dispense:  20 tablet    Refill:  0    Order Specific Question:   Supervising Provider    Answer:   Isa Rankin [phone]  . naproxen (NAPROSYN) 375 MG tablet    Sig: Take 1 tablet (375 mg total) by mouth 2 (two) times daily as needed.    Dispense:  10 tablet    Refill:  0    Order Specific Question:   Supervising Provider    Answer:   Isa Rankin [[id number]]   Use naproxen as needed for pain Augmentin prescribed.  Use as directed and to completion Recommend soft diet until evaluated by dentist Maintain oral hygiene  care Follow up with dentist as soon as possible for further evaluation and treatment   Reviewed expectations re: course of current medical issues. Questions answered. Outlined signs and symptoms indicating need for more acute intervention. Patient verbalized understanding. After Visit Summary given.   Rennis Harding, [name] 06/11/18 8119

## 2018-06-11 NOTE — ED Triage Notes (Signed)
Patient reports right lower dental pain with swelling. States he has been switching up his chewing due to dental work needing to be done.

## 2018-07-28 DIAGNOSIS — R69 Illness, unspecified: Secondary | ICD-10-CM | POA: Diagnosis not present

## 2018-08-27 DIAGNOSIS — R69 Illness, unspecified: Secondary | ICD-10-CM | POA: Diagnosis not present

## 2018-10-07 DIAGNOSIS — R69 Illness, unspecified: Secondary | ICD-10-CM | POA: Diagnosis not present

## 2018-11-21 ENCOUNTER — Ambulatory Visit (HOSPITAL_COMMUNITY)
Admission: EM | Admit: 2018-11-21 | Discharge: 2018-11-21 | Disposition: A | Discharge status: Home / Self Care | Payer: Medicare HMO

## 2018-11-21 ENCOUNTER — Encounter (HOSPITAL_COMMUNITY): Payer: Self-pay

## 2018-11-21 ENCOUNTER — Other Ambulatory Visit: Payer: Self-pay

## 2018-11-21 DIAGNOSIS — B353 Tinea pedis: Secondary | ICD-10-CM | POA: Diagnosis not present

## 2018-11-21 MED ORDER — CLOTRIMAZOLE-BETAMETHASONE 1-0.05 % EX CREA: TOPICAL_CREAM | CUTANEOUS | 0 refills | Status: DC

## 2018-11-21 NOTE — ED Triage Notes (Signed)
Patient presents to Urgent Care with complaints of left little toe pain since yesterday. Patient states he has had an ingrown toenail there in the past but this "feels more like an infection".

## 2018-11-21 NOTE — ED Provider Notes (Signed)
11/21/2018 4:34 PM   DOB: 1951-02-23 / MRN: [id number]  SUBJECTIVE:  Jordan Nelson is a 68 y.o. male presenting for toe interdigital fissuring that started a few days ago.  Denies itching.  Has tried nothing.   He has No Known Allergies.   He  has a past medical history of Arthritis, Chronic back pain, Epigastric hernia, GERD (gastroesophageal reflux disease), Numbness, Pollen allergy, Scoliosis, Tuberculosis, and Wears glasses.    He  reports that he has quit smoking. His smoking use included cigarettes. He has never used smokeless tobacco. He reports that he does not drink alcohol or use drugs. He  has no history on file for sexual activity. The patient  has a past surgical history that includes Hernia repair and Eye surgery.  His family history includes Hypertension in his mother.  Review of Systems  Skin: Positive for rash. Negative for itching.    OBJECTIVE:  BP 136/82 (BP Location: Right Arm)   Temp 98.2 F (36.8 C) (Oral)   Resp 17   SpO2 100%   Wt Readings from Last 3 Encounters:  05/16/17 172 lb (78 kg)  05/06/17 172 lb 9.6 oz (78.3 kg)  01/23/17 180 lb (81.6 kg)   Temp Readings from Last 3 Encounters:  11/21/18 98.2 F (36.8 C) (Oral)  06/11/18 98.3 F (36.8 C) (Oral)  10/22/17 98.4 F (36.9 C) (Oral)   BP Readings from Last 3 Encounters:  11/21/18 136/82  06/11/18 127/86  10/23/17 (!) 133/98   Pulse Readings from Last 3 Encounters:  06/11/18 66  10/23/17 67  05/16/17 78    Physical Exam Vitals signs and nursing note reviewed.  Constitutional:      Appearance: He is well-developed. He is not ill-appearing or diaphoretic.  Eyes:     Conjunctiva/sclera: Conjunctivae normal.     Pupils: Pupils are equal, round, and reactive to light.  Cardiovascular:     Rate and Rhythm: Normal rate.  Pulmonary:     Effort: Pulmonary effort is normal.  Abdominal:     General: There is no distension.  Musculoskeletal: Normal range of motion.  Skin:    General:  Skin is warm and dry.  Neurological:     Mental Status: He is alert and oriented to person, place, and time.     Cranial Nerves: No cranial nerve deficit.     Coordination: Coordination normal.     No results found for this or any previous visit (from the past 72 hour(s)).  No results found.  ASSESSMENT AND PLAN:   Tinea pedis of left foot    Discharge Instructions     Use an otc spray or lotion for 2 weeks.  If not improved then purchase the prescription.         The patient is advised to call or return to clinic if he does not see an improvement in symptoms, or to seek the care of the closest emergency department if he worsens with the above plan.   Deliah Boston, MHS, [name] 11/21/2018 4:34 PM   Ofilia Neas, [name] 11/21/18 1634

## 2018-11-21 NOTE — Discharge Instructions (Signed)
Use an otc spray or lotion for 2 weeks.  If not improved then purchase the prescription.

## 2018-12-24 ENCOUNTER — Encounter (HOSPITAL_COMMUNITY): Payer: Self-pay | Admitting: Emergency Medicine

## 2018-12-24 ENCOUNTER — Other Ambulatory Visit: Payer: Self-pay

## 2018-12-24 ENCOUNTER — Ambulatory Visit (HOSPITAL_COMMUNITY)
Admission: EM | Admit: 2018-12-24 | Discharge: 2018-12-24 | Disposition: A | Discharge status: Home / Self Care | Payer: Medicare HMO | Attending: Family Medicine | Admitting: Family Medicine

## 2018-12-24 DIAGNOSIS — B353 Tinea pedis: Secondary | ICD-10-CM | POA: Diagnosis not present

## 2018-12-24 MED ORDER — CLOTRIMAZOLE 1 % EX CREA: TOPICAL_CREAM | CUTANEOUS | 0 refills | Status: DC

## 2018-12-24 NOTE — Discharge Instructions (Signed)
You may also try using over the counter Domeboro solution.

## 2018-12-24 NOTE — ED Triage Notes (Addendum)
Patient seen on 4/11 for the same rash.  Reports rash seemed to improve, but now has spread to another toe  Prefers hard copy script tonight

## 2018-12-29 NOTE — ED Provider Notes (Signed)
[facility]   [id number] 12/24/18 Arrival Time: 1727  ASSESSMENT & PLAN:  1. Tinea pedis of both feet    No signs of cellulitis or superficial infection. Discussed.  Meds ordered this encounter  Medications  . clotrimazole (LOTRIMIN) 1 % cream    Sig: Apply to affected area 2 times daily    Dispense:  30 g    Refill:  0   Will follow up with PCP or here if worsening or failing to improve as anticipated.   Discharge Instructions     You may also try using over the counter Domeboro solution.     Reviewed expectations re: course of current medical issues. Questions answered. Outlined signs and symptoms indicating need for more acute intervention. Patient verbalized understanding. After Visit Summary given.   SUBJECTIVE:  Jordan Nelson is a 68 y.o. male who presents with a skin complaint.   Location: both feet; reports h/o athletes foot; "creams have helped before" Onset: gradual Duration: at least 1 month Associated pruritis? mild to moderate Associated pain? occasionally Progression: stable  Drainage? No  New soaps/lotions/topicals/detergents/environmental exposures? No Contacts with similar? No Recent travel? No  Other associated symptoms: none Therapies tried thus far: none Arthralgia or myalgia? none Recent illness? none Fever? none No specific aggravating or alleviating factors reported.  ROS: As per HPI. All other systems negative.   OBJECTIVE: Vitals:   12/24/18 1747  BP: 126/71  Pulse: 85  Resp: 16  Temp: 98.3 F (36.8 C)  TempSrc: Oral  SpO2: 98%    General appearance: alert; no distress Lungs: clear to auscultation bilaterally Heart: regular rate and rhythm Extremities: no edema Skin: warm and dry; no signs of infection; diffuse hyperkeratotic eruption involving distal planter foot; mainly between toes; some underlying erythema Psychological: alert and cooperative; normal mood and affect  No Known Allergies  Past Medical  History:  Diagnosis Date  . Arthritis   . Chronic back pain   . Epigastric hernia   . GERD (gastroesophageal reflux disease)   . Numbness    right hand  . Pollen allergy   . Scoliosis    " minor"  . Tuberculosis    early 1980's  . Wears glasses    Social History   Socioeconomic History  . Marital status: Single    Spouse name: Not on file  . Number of children: Not on file  . Years of education: Not on file  . Highest education level: Not on file  Occupational History  . Not on file  Social Needs  . Financial resource strain: Not on file  . Food insecurity:    Worry: Not on file    Inability: Not on file  . Transportation needs:    Medical: Not on file    Non-medical: Not on file  Tobacco Use  . Smoking status: Former Smoker    Types: Cigarettes  . Smokeless tobacco: Never Used  Substance and Sexual Activity  . Alcohol use: No  . Drug use: No  . Sexual activity: Not on file  Lifestyle  . Physical activity:    Days per week: Not on file    Minutes per session: Not on file  . Stress: Not on file  Relationships  . Social connections:    Talks on phone: Not on file    Gets together: Not on file    Attends religious service: Not on file    Active member of club or organization: Not on file  Attends meetings of clubs or organizations: Not on file    Relationship status: Not on file  . Intimate partner violence:    Fear of current or ex partner: Not on file    Emotionally abused: Not on file    Physically abused: Not on file    Forced sexual activity: Not on file  Other Topics Concern  . Not on file  Social History Narrative  . Not on file   Family History  Problem Relation Age of Onset  . Hypertension Mother    Past Surgical History:  Procedure Laterality Date  . EYE SURGERY    . HERNIA REPAIR       Mardella Layman, MD 12/29/18 1005

## 2019-02-28 ENCOUNTER — Encounter (HOSPITAL_COMMUNITY): Payer: Self-pay | Admitting: Emergency Medicine

## 2019-02-28 ENCOUNTER — Ambulatory Visit (HOSPITAL_COMMUNITY)
Admission: EM | Admit: 2019-02-28 | Discharge: 2019-02-28 | Disposition: A | Discharge status: Home / Self Care | Payer: Medicare HMO | Attending: Family Medicine | Admitting: Family Medicine

## 2019-02-28 ENCOUNTER — Other Ambulatory Visit: Payer: Self-pay

## 2019-02-28 DIAGNOSIS — B353 Tinea pedis: Secondary | ICD-10-CM | POA: Diagnosis not present

## 2019-02-28 MED ORDER — CLOTRIMAZOLE-BETAMETHASONE 1-0.05 % EX CREA: TOPICAL_CREAM | CUTANEOUS | 0 refills | Status: DC

## 2019-02-28 NOTE — ED Triage Notes (Signed)
Pt here for wound check to right leg after having wound x several weeks; pt also requesting RX for foot fungus

## 2019-02-28 NOTE — Discharge Instructions (Signed)
Use the clotrimazole cream 2 x a day for 2 weeks Repeat as needed  Over the counter medicine like Lamisil also works, you can try this if the prescription cream runs out

## 2019-02-28 NOTE — ED Provider Notes (Signed)
[facility]    CSN: [id number] Arrival date & time: 02/28/19  1614      History   Chief Complaint Chief Complaint  Patient presents with  . Wound Check    HPI Jordan Nelson is a 68 y.o. male.   HPI  Patient has chronic tinea pedis.  He came here for a visit and was given Lotrisone.  He states this worked very well.  When he came back for follow-up he was given plain Clotrimazole.  He states this did not work as well.  He would like a refill of the Lotrisone. I discussed with the patient that over-the-counter product such as Lamisil usually works on tinea pedis.  He is advised to get this if he runs out of his Lotrisone. [facility].  He has been unable to go there for follow-up because of the COVID-19 pandemic.  He has an appointment scheduled for August. No other health concerns He scraped his shin a few weeks ago.  He still has a small wound that is healing.  Would like this looked at.   Past Medical History:  Diagnosis Date  . Arthritis   . Chronic back pain   . Epigastric hernia   . GERD (gastroesophageal reflux disease)   . Numbness    right hand  . Pollen allergy   . Scoliosis    " minor"  . Tuberculosis    early 1980's  . Wears glasses     There are no active problems to display for this patient.   Past Surgical History:  Procedure Laterality Date  . EYE SURGERY    . HERNIA REPAIR         Home Medications    Prior to Admission medications   Medication Sig Start Date End Date Taking? Authorizing Provider  cholecalciferol (VITAMIN D3) 25 MCG (1000 UT) tablet Take 1,000 Units by mouth daily.    [provider]  clotrimazole (LOTRIMIN) 1 % cream Apply to affected area 2 times daily 12/24/18   Mardella LaymanHagler, Brian, MD  clotrimazole-betamethasone (LOTRISONE) cream Apply to affected area 2 times daily prn 02/28/19   Eustace MooreNelson, Audra Kagel Sue, MD    Family History Family History  Problem Relation Age of Onset  .  Hypertension Mother     Social History Social History   Tobacco Use  . Smoking status: Former Smoker    Types: Cigarettes  . Smokeless tobacco: Never Used  Substance Use Topics  . Alcohol use: No  . Drug use: No     Allergies   Patient has no known allergies.   Review of Systems Review of Systems  Constitutional: Negative for chills and fever.  HENT: Negative for ear pain and sore throat.   Eyes: Negative for pain and visual disturbance.  Respiratory: Negative for cough and shortness of breath.   Cardiovascular: Negative for chest pain and palpitations.  Gastrointestinal: Negative for abdominal pain and vomiting.  Genitourinary: Negative for dysuria and hematuria.  Musculoskeletal: Negative for arthralgias and back pain.  Skin: Positive for rash and wound. Negative for color change.  Neurological: Negative for seizures and syncope.  All other systems reviewed and are negative.    Physical Exam Triage Vital Signs ED Triage Vitals [02/28/19 1630]  Enc Vitals Group     BP      Pulse      Resp      Temp      Temp src  SpO2      Weight      Height      Head Circumference      Peak Flow      Pain Score 0     Pain Loc      Pain Edu?      Excl. in [place]?    No data found.  Updated Vital Signs There were no vitals taken for this visit.  Visual Acuity Right Eye Distance:   Left Eye Distance:   Bilateral Distance:    Right Eye Near:   Left Eye Near:    Bilateral Near:     Physical Exam Constitutional:      General: He is not in acute distress.    Appearance: He is well-developed.  HENT:     Head: Normocephalic and atraumatic.  Eyes:     Conjunctiva/sclera: Conjunctivae normal.     Pupils: Pupils are equal, round, and reactive to light.  Neck:     Musculoskeletal: Normal range of motion.  Cardiovascular:     Rate and Rhythm: Normal rate.  Pulmonary:     Effort: Pulmonary effort is normal. No respiratory distress.  Abdominal:     General: There  is no distension.     Palpations: Abdomen is soft.  Musculoskeletal: Normal range of motion.  Feet:     Comments: Both feet have interdigital maceration with some peeling Skin:    General: Skin is warm and dry.     Comments: In the center of the right shin there is a 15 mm superficial eschar.  No surrounding erythema.  No bleeding.  No infection.  This is healing  Neurological:     Mental Status: He is alert.      UC Treatments / Results  Labs (all labs ordered are listed, but only abnormal results are displayed) Labs Reviewed - No data to display  EKG   Radiology No results found.  Procedures Procedures (including critical care time)  Medications Ordered in UC Medications - No data to display  Initial Impression / Assessment and Plan / UC Course  I have reviewed the triage vital signs and the nursing notes.  Pertinent labs & imaging results that were available during my care of the patient were reviewed by me and considered in my medical decision making (see chart for details).      Final Clinical Impressions(s) / UC Diagnoses   Final diagnoses:  Recurrent tinea pedis     Discharge Instructions     Use the clotrimazole cream 2 x a day for 2 weeks Repeat as needed  Over the counter medicine like Lamisil also works, you can try this if the prescription cream runs out   ED Prescriptions    Medication Sig Dispense Auth. Provider   clotrimazole-betamethasone (LOTRISONE) cream Apply to affected area 2 times daily prn 45 g Raylene Everts, MD     Controlled Substance Prescriptions [place] Controlled Substance Registry consulted? no   Raylene Everts, MD 02/28/19 [phone]

## 2019-05-13 ENCOUNTER — Ambulatory Visit (HOSPITAL_COMMUNITY)
Admission: EM | Admit: 2019-05-13 | Discharge: 2019-05-13 | Disposition: A | Discharge status: Home / Self Care | Payer: Medicare HMO | Attending: Family Medicine | Admitting: Family Medicine

## 2019-05-13 ENCOUNTER — Other Ambulatory Visit: Payer: Self-pay

## 2019-05-13 ENCOUNTER — Encounter (HOSPITAL_COMMUNITY): Payer: Self-pay

## 2019-05-13 DIAGNOSIS — Z5189 Encounter for other specified aftercare: Secondary | ICD-10-CM

## 2019-05-13 DIAGNOSIS — S80921D Unspecified superficial injury of right lower leg, subsequent encounter: Secondary | ICD-10-CM | POA: Diagnosis not present

## 2019-05-13 DIAGNOSIS — R69 Illness, unspecified: Secondary | ICD-10-CM | POA: Diagnosis not present

## 2019-05-13 NOTE — ED Provider Notes (Signed)
[facility]   [id number] 05/13/19 Arrival Time: 1632  ASSESSMENT & PLAN:  1. Visit for wound check     No signs of infection. Appears to be healing. Signs of venous insufficiency. Discussed.  If interested he may explore evaluation by: Follow-up Information    [facility]             .   Contact information: [address] [place] [address] [phone]       [facility].   Specialty: Urgent Care Why: As needed. Contact information: [address] [place] [address] [phone]          Reviewed expectations re: course of current medical issues. Questions answered. Outlined signs and symptoms indicating need for more acute intervention. Patient verbalized understanding. After Visit Summary given.   SUBJECTIVE:  Jordan Nelson is a 68 y.o. male who presents with a skin complaint. Seen here a couple of months ago. R shin wound. Has been healing but he bumps it occasionally with minor bleeding. Reports no surrounding erythema. No active drainage. Area does get quite sore at times. Ambulatory without difficulty. No LE edema reported.   ROS: As per HPI.  OBJECTIVE: Vitals:   05/13/19 1650  BP: 133/88  Pulse: 80  Resp: 15  Temp: 98.5 F (36.9 C)  TempSrc: Oral  SpO2: 99%    General appearance: alert; no distress HEENT: [place]; AT Neck: supple with FROM Lungs: unlabored respirations Heart: regular rate and rhythm Extremities: no edema; moves all extremities normally Skin: warm and dry; R shin with approx 1.1 cm superficial wound that appears to be healing; venous stasis skin changes; no signs of bacterial infection Psychological: alert and cooperative; normal mood and affect  No Known Allergies  Past Medical History:  Diagnosis Date  . Arthritis   . Chronic back pain   . Epigastric hernia   . GERD (gastroesophageal reflux  disease)   . Numbness    right hand  . Pollen allergy   . Scoliosis    " minor"  . Tuberculosis    early 1980's  . Wears glasses    Social History   Socioeconomic History  . Marital status: Single    Spouse name: Not on file  . Number of children: Not on file  . Years of education: Not on file  . Highest education level: Not on file  Occupational History  . Not on file  Social Needs  . Financial resource strain: Not on file  . Food insecurity    Worry: Not on file    Inability: Not on file  . Transportation needs    Medical: Not on file    Non-medical: Not on file  Tobacco Use  . Smoking status: Former Smoker    Types: Cigarettes  . Smokeless tobacco: Never Used  Substance and Sexual Activity  . Alcohol use: No  . Drug use: No  . Sexual activity: Not on file  Lifestyle  . Physical activity    Days per week: Not on file    Minutes per session: Not on file  . Stress: Not on file  Relationships  . Social Musician on phone: Not on file    Gets together: Not on file    Attends religious service: Not on file    Active member of club or organization: Not on file    Attends meetings of  clubs or organizations: Not on file    Relationship status: Not on file  . Intimate partner violence    Fear of current or ex partner: Not on file    Emotionally abused: Not on file    Physically abused: Not on file    Forced sexual activity: Not on file  Other Topics Concern  . Not on file  Social History Narrative  . Not on file   Family History  Problem Relation Age of Onset  . Hypertension Mother    Past Surgical History:  Procedure Laterality Date  . EYE SURGERY    . HERNIA REPAIR       Vanessa Kick, MD 05/13/19 [phone]

## 2019-05-13 NOTE — ED Triage Notes (Signed)
Patient presents to Urgent Care with complaints of needing wound check on right shin since he bumped it a few months ago. Patient reports he has been putting antibiotic cream on it and keeping gauze on it. Borders of wound are darkly colored, some drainage noted but mostly dried blood.

## 2019-06-04 ENCOUNTER — Encounter (HOSPITAL_BASED_OUTPATIENT_CLINIC_OR_DEPARTMENT_OTHER): Payer: Medicare HMO | Attending: Internal Medicine | Admitting: Internal Medicine

## 2019-06-04 ENCOUNTER — Other Ambulatory Visit: Payer: Self-pay

## 2019-06-04 DIAGNOSIS — L97812 Non-pressure chronic ulcer of other part of right lower leg with fat layer exposed: Secondary | ICD-10-CM | POA: Diagnosis not present

## 2019-06-04 DIAGNOSIS — I87321 Chronic venous hypertension (idiopathic) with inflammation of right lower extremity: Secondary | ICD-10-CM | POA: Insufficient documentation

## 2019-06-04 DIAGNOSIS — I87311 Chronic venous hypertension (idiopathic) with ulcer of right lower extremity: Secondary | ICD-10-CM | POA: Diagnosis not present

## 2019-06-09 ENCOUNTER — Other Ambulatory Visit: Payer: Self-pay

## 2019-06-09 ENCOUNTER — Encounter (HOSPITAL_BASED_OUTPATIENT_CLINIC_OR_DEPARTMENT_OTHER): Payer: Medicare HMO | Admitting: Physician Assistant

## 2019-06-09 DIAGNOSIS — I87321 Chronic venous hypertension (idiopathic) with inflammation of right lower extremity: Secondary | ICD-10-CM | POA: Diagnosis not present

## 2019-06-09 DIAGNOSIS — L97812 Non-pressure chronic ulcer of other part of right lower leg with fat layer exposed: Secondary | ICD-10-CM | POA: Diagnosis not present

## 2019-06-16 NOTE — Progress Notes (Signed)
DEVION, CHRISCOE ([id number]) Visit Report for 06/09/2019 SuperBill Details Patient Name: Date of Service: [name], Jordan Nelson 06/09/2019 Medical Record [id number] Patient Account Number: [id number] Date of Birth/Sex: Treating RN: 25-Nov-1950 (68 y.o. Ernestene Mention Primary Care Provider: [facility], [facility] Other Clinician: Referring Provider: Treating Provider/Extender:Stone III, [facility],  Weeks in Treatment: 0 Diagnosis Coding ICD-10 Codes Code Description [phone] Non-pressure chronic ulcer of other part of right lower leg limited to breakdown of skin I87.321 Chronic venous hypertension (idiopathic) with inflammation of right lower extremity Facility Procedures CPT4 Code Description Modifier Quantity [phone] (Facility Use Only) [phone] - APPLY MULTLAY COMPRS LWR RT LEG 1 Electronic Signature(s) Signed: 06/09/2019 1:37:03 PM By: Baruch Gouty RN, BSN Signed: 06/16/2019 6:48:11 PM By: Worthy Keeler [name] Entered By: Baruch Gouty on 06/09/2019 10:24:13

## 2019-06-18 ENCOUNTER — Other Ambulatory Visit: Payer: Self-pay

## 2019-06-18 ENCOUNTER — Encounter (HOSPITAL_BASED_OUTPATIENT_CLINIC_OR_DEPARTMENT_OTHER): Payer: Medicare HMO | Attending: Internal Medicine | Admitting: Internal Medicine

## 2019-06-18 DIAGNOSIS — L97811 Non-pressure chronic ulcer of other part of right lower leg limited to breakdown of skin: Secondary | ICD-10-CM | POA: Insufficient documentation

## 2019-06-18 DIAGNOSIS — I872 Venous insufficiency (chronic) (peripheral): Secondary | ICD-10-CM | POA: Insufficient documentation

## 2019-06-18 DIAGNOSIS — I10 Essential (primary) hypertension: Secondary | ICD-10-CM | POA: Diagnosis not present

## 2019-06-18 DIAGNOSIS — Z6825 Body mass index (BMI) 25.0-25.9, adult: Secondary | ICD-10-CM | POA: Insufficient documentation

## 2019-06-18 DIAGNOSIS — I87311 Chronic venous hypertension (idiopathic) with ulcer of right lower extremity: Secondary | ICD-10-CM | POA: Diagnosis not present

## 2019-06-18 DIAGNOSIS — L97812 Non-pressure chronic ulcer of other part of right lower leg with fat layer exposed: Secondary | ICD-10-CM | POA: Diagnosis not present

## 2019-06-18 DIAGNOSIS — I87321 Chronic venous hypertension (idiopathic) with inflammation of right lower extremity: Secondary | ICD-10-CM | POA: Insufficient documentation

## 2019-06-20 NOTE — Progress Notes (Signed)
Jordan Nelson, Jordan L. ([id number]) Visit Report for 06/18/2019 Debridement Details Patient Name: Date of Service: Jordan Nelson, Jordan L. 06/18/2019 12:30 PM Medical Record [id number] Patient Account Number: [id number] Date of Birth/Sex: 05/27/1951 (68 y.o. M) Treating RN: Cherylin Mylarwiggins, Shannon Primary Care Provider: [facility], [place] Other Clinician: Referring Provider: [facility], [place] Treating Provider/Extender:Amaan Meyer, Lamar SprinklesMichael Weeks in Treatment: 2 Debridement Performed for Wound #1 Right,Anterior Lower Leg Assessment: Performed By: Physician Maxwell Caulobson, Lakelynn Severtson G., MD Debridement Type: Debridement Severity of Tissue Pre Fat layer exposed Debridement: Level of Consciousness (Pre- Awake and Alert procedure): Pre-procedure Verification/Time Out Taken: Yes - 13:35 Start Time: 13:35 Pain Control: Other : Benzocaine 20% Total Area Debrided (L x W): 3 (cm) x 1 (cm) = 3 (cm) Tissue and other material Skin: Epidermis, Fibrin/Exudate debrided: Level: Skin/Epidermis Debridement Description: Selective/Open Wound Instrument: Curette Bleeding: Minimum End Time: 13:36 Procedural Pain: 0 Post Procedural Pain: 0 Response to Treatment: Procedure was tolerated well Level of Consciousness Awake and Alert (Post-procedure): Post Debridement Measurements of Total Wound Length: (cm) 4 Width: (cm) 1.4 Depth: (cm) 0.1 Volume: (cm) 0.44 Character of Wound/Ulcer Post Improved Debridement: Severity of Tissue Post Debridement: Fat layer exposed Post Procedure Diagnosis Same as Pre-procedure Electronic Signature(s) Signed: 06/18/2019 6:04:56 PM By: Cherylin Mylarwiggins, Shannon Signed: 06/20/2019 8:54:13 AM By: Baltazar Najjarobson, Hondo Nanda MD Entered By: Cherylin Mylarwiggins, Shannon on 06/18/2019 13:37:39 -------------------------------------------------------------------------------- HPI Details Patient Name: Date of Service: Jordan Nelson, Jordan L. 06/18/2019 12:30 PM Medical Record [id number] Patient Account Number:  [id number] Date of Birth/Sex: Treating RN: 05/27/1951 (68 y.o. Jordan Nelson) Jordan Nelson Primary Care Provider: CLIN[facility], [place] Other Clinician: Referring Provider: Treating Provider/Extender:Zacarias Krauter, [facility] [place] Weeks in Treatment: 2 History of Present Illness HPI Description: ADMISSION 06/04/2019 This is a 68 year old man who is not a diabetic who traumatized his right mid tibial area moving furniture about 4 months ago. He has been using triple antibiotic about a month ago he hit it again while he was in the shower. He was seen in urgent care on 05/13/2019 I think they recommended the topical antibiotics no x-rays or cultures were done. He does have a history of wounds on his legs often with minor trauma especially on the right but also on the left. Past medical history tinea pedis, umbilical hernia, hypertension, gastroesophageal reflux disease, vitamin D deficiency Contact with TB treated in the 1980s. He states he was never ill ABI in the right was 0.92 11/6; traumatic wound in the setting of chronic venous insufficiency debridement last week using silver alginate. Wound is measuring smaller Electronic Signature(s) Signed: 06/20/2019 8:54:13 AM By: Baltazar Najjarobson, Novak Stgermaine MD Entered By: Baltazar Najjarobson, Kanani Mowbray on 06/18/2019 13:37:25 -------------------------------------------------------------------------------- Physical Exam Details Patient Name: Date of Service: Jordan Nelson, Jordan L. 06/18/2019 12:30 PM Medical Record [id number] Patient Account Number: [id number] Date of Birth/Sex: Treating RN: 05/27/1951 (68 y.o. Jordan Nelson) Jordan Nelson Primary Care Provider: CL[facility], [place] Other Clinician: Referring Provider: Treating Provider/Extender:Carly Applegate, [facility],  Weeks in Treatment: 2 Constitutional Sitting or standing Blood Pressure is within target range for patient.. Pulse regular and within target range for patient.Marland Kitchen. Respirations regular, non-labored and  within target range.. Temperature is normal and within the target range for the patient.Marland Kitchen. Appears in no distress. Notes Wound exam; the area in question is in the right mid tibia. Still eschar and debris on the wound circumference that I removed with a #5 curette. No change in the wound surface surface area is better Electronic Signature(s) Signed: 06/20/2019 8:54:13 AM By: Baltazar Najjarobson, Norleen Xie MD Entered By: Baltazar Najjarobson, Erick Oxendine on 06/18/2019 13:38:18 -------------------------------------------------------------------------------- Physician Orders Details Patient Name: Date of Service:  [name], [name] 06/18/2019 12:30 PM Medical Record [id number] Patient Account Number: [id number] Date of Birth/Sex: Treating RN: [birth year] (68 y.o. Marvis Repress Primary Care Provider: [facility], [place] Other Clinician: Referring Provider: Treating Provider/Extender:Roscoe Witts, [facility], [place] Weeks in Treatment: 2 Verbal / Phone Orders: No Diagnosis Coding ICD-10 Coding Code Description L97.811 Non-pressure chronic ulcer of other part of right lower leg limited to breakdown of skin I87.321 Chronic venous hypertension (idiopathic) with inflammation of right lower extremity Follow-up Appointments Return Appointment in 1 week. Dressing Change Frequency Wound #1 Right,Anterior Lower Leg Do not change entire dressing for one week. Skin Barriers/Peri-Wound Care Wound #1 Right,Anterior Lower Leg Moisturizing lotion Wound Cleansing Wound #1 Right,Anterior Lower Leg May shower with protection. Primary Wound Dressing Wound #1 Right,Anterior Lower Leg Calcium Alginate with Silver Secondary Dressing Wound #1 Right,Anterior Lower Leg Dry Gauze ABD pad Edema Control 3 Layer Compression System - Right Lower Extremity Avoid standing for long periods of time Elevate legs to the level of the heart or above for 30 minutes daily and/or when sitting, a frequency of: - throughout the  day Exercise regularly Electronic Signature(s) Signed: 06/18/2019 6:04:56 PM By: Kela Millin Signed: 06/20/2019 8:54:13 AM By: Linton Ham MD Entered By: Kela Millin on 06/18/2019 13:38:42 -------------------------------------------------------------------------------- Problem List Details Patient Name: Date of Service: Siri Cole 06/18/2019 12:30 PM Medical Record [id number] Patient Account Number: [id number] Date of Birth/Sex: Treating RN: 1951-04-03 (68 y.o. Marvis Repress Primary Care Provider: CL[facility], [place] Other Clinician: Referring Provider: Treating Provider/Extender:Luci Bellucci, Linus Galas, [place] Weeks in Treatment: 2 Active Problems ICD-10 Evaluated Encounter Code Description Active Date Today Diagnosis L97.811 Non-pressure chronic ulcer of other part of right lower 06/04/2019 No Yes leg limited to breakdown of skin I87.321 Chronic venous hypertension (idiopathic) with 06/04/2019 No Yes inflammation of right lower extremity Inactive Problems Resolved Problems Electronic Signature(s) Signed: 06/20/2019 8:54:13 AM By: Linton Ham MD Entered By: Linton Ham on 06/18/2019 13:36:46 -------------------------------------------------------------------------------- Progress Note Details Patient Name: Date of Service: Siri Cole 06/18/2019 12:30 PM Medical Record [id number] Patient Account Number: [id number] Date of Birth/Sex: Treating RN: 1951/08/01 (68 y.o. Ernestene Mention Primary Care Provider: CLINIC, [place] Other Clinician: Referring Provider: Treating Provider/Extender:Candon Caras, [facility], [place] Weeks in Treatment: 2 Subjective History of Present Illness (HPI) ADMISSION 06/04/2019 This is a 68 year old man who is not a diabetic who traumatized his right mid tibial area moving furniture about 4 months ago. He has been using triple antibiotic about a month ago he hit it again while  he was in the shower. He was seen in urgent care on 05/13/2019 I think they recommended the topical antibiotics no x-rays or cultures were done. He does have a history of wounds on his legs often with minor trauma especially on the right but also on the left. Past medical history tinea pedis, umbilical hernia, hypertension, gastroesophageal reflux disease, vitamin D deficiency Contact with TB treated in the 1980s. He states he was never ill ABI in the right was 0.92 11/6; traumatic wound in the setting of chronic venous insufficiency debridement last week using silver alginate. Wound is measuring smaller Objective Constitutional Sitting or standing Blood Pressure is within target range for patient.. Pulse regular and within target range for patient.Marland Kitchen Respirations regular, non-labored and within target range.. Temperature is normal and within the target range for the patient.Marland Kitchen Appears in no distress. Vitals Time Taken: 1:02 PM, Height: 71 in, Weight: 180 lbs, BMI: 25.1, Temperature: 98.2 F, Pulse: 66 bpm, Respiratory Rate: 18 breaths/min, Blood Pressure: 118/78  mmHg. General Notes: Wound exam; the area in question is in the right mid tibia. Still eschar and debris on the wound circumference that I removed with a #5 curette. No change in the wound surface surface area is better Integumentary (Hair, Skin) Wound #1 status is Open. Original cause of wound was Trauma. The wound is located on the Right,Anterior Lower Leg. The wound measures 4cm length x 1.4cm width x 0.1cm depth; 4.398cm^2 area and 0.44cm^3 volume. There is Fat Layer (Subcutaneous Tissue) Exposed exposed. There is no tunneling or undermining noted. There is a medium amount of serosanguineous drainage noted. The wound margin is thickened. There is large (67-100%) red granulation within the wound bed. There is no necrotic tissue within the wound bed. Assessment Active Problems ICD-10 Non-pressure chronic ulcer of other part of  right lower leg limited to breakdown of skin Chronic venous hypertension (idiopathic) with inflammation of right lower extremity Procedures Wound #1 Pre-procedure diagnosis of Wound #1 is a Venous Leg Ulcer located on the Right,Anterior Lower Leg .Severity of Tissue Pre Debridement is: Fat layer exposed. There was a Selective/Open Wound Skin/Epidermis Debridement with a total area of 3 sq cm performed by Maxwell Caul., MD. With the following instrument(s): Curette Material removed includes Skin: Epidermis and Fibrin/Exudate and after achieving pain control using Other (Benzocaine 20%). No specimens were taken. A time out was conducted at 13:35, prior to the start of the procedure. A Minimum amount of bleeding was controlled with N/A. The procedure was tolerated well with a pain level of 0 throughout and a pain level of 0 following the procedure. Post Debridement Measurements: 4cm length x 1.4cm width x 0.1cm depth; 0.44cm^3 volume. Character of Wound/Ulcer Post Debridement is improved. Severity of Tissue Post Debridement is: Fat layer exposed. Post procedure Diagnosis Wound #1: Same as Pre-Procedure Pre-procedure diagnosis of Wound #1 is a Venous Leg Ulcer located on the Right,Anterior Lower Leg . There was a Three Layer Compression Therapy Procedure by Shawn Stall, RN. Post procedure Diagnosis Wound #1: Same as Pre-Procedure Plan 1. Continue with silver alginate under compression 2. Wound surface looks dry. If the dimensions are not better might change to silver collagen or Hydrofera Blue Electronic Signature(s) Signed: 06/20/2019 8:54:13 AM By: Baltazar Najjar MD Entered By: Baltazar Najjar on 06/18/2019 13:39:00 -------------------------------------------------------------------------------- SuperBill Details Patient Name: Date of Service: Jordan Adam 06/18/2019 Medical Record [id number] Patient Account Number: [id number] Date of Birth/Sex: Treating RN: May 19, 1951  (68 y.o. Jordan Schooner Primary Care Provider: CLINIC, [place] Other Clinician: Referring Provider: Treating Provider/Extender:Beyonka Pitney, Victorino December, [place] Weeks in Treatment: 2 Diagnosis Coding ICD-10 Codes Code Description [phone] Non-pressure chronic ulcer of other part of right lower leg limited to breakdown of skin I87.321 Chronic venous hypertension (idiopathic) with inflammation of right lower extremity Facility Procedures CPT4 Code Description: [id number] - DEBRIDE WOUND 1ST 20 SQ CM OR < ICD-10 Diagnosis Description L97.811 Non-pressure chronic ulcer of other part of right lower le skin Modifier: g limited to b Quantity: 1 reakdown of Physician Procedures CPT4 Code Description: [id number] - WC PHYS DEBR WO ANESTH 20 SQ CM ICD-10 Diagnosis Description L97.811 Non-pressure chronic ulcer of other part of right lower leg skin Modifier: limited to brea Quantity: 1 kdown of Electronic Signature(s) Signed: 06/20/2019 8:54:13 AM By: Baltazar Najjar MD Entered By: Baltazar Najjar on 06/18/2019 13:39:23

## 2019-06-25 ENCOUNTER — Encounter (HOSPITAL_BASED_OUTPATIENT_CLINIC_OR_DEPARTMENT_OTHER): Payer: Medicare HMO | Admitting: Internal Medicine

## 2019-06-25 ENCOUNTER — Other Ambulatory Visit: Payer: Self-pay

## 2019-06-25 DIAGNOSIS — Z6825 Body mass index (BMI) 25.0-25.9, adult: Secondary | ICD-10-CM | POA: Diagnosis not present

## 2019-06-25 DIAGNOSIS — I10 Essential (primary) hypertension: Secondary | ICD-10-CM | POA: Diagnosis not present

## 2019-06-25 DIAGNOSIS — I872 Venous insufficiency (chronic) (peripheral): Secondary | ICD-10-CM | POA: Diagnosis not present

## 2019-06-25 DIAGNOSIS — L97811 Non-pressure chronic ulcer of other part of right lower leg limited to breakdown of skin: Secondary | ICD-10-CM | POA: Diagnosis not present

## 2019-06-25 DIAGNOSIS — I87321 Chronic venous hypertension (idiopathic) with inflammation of right lower extremity: Secondary | ICD-10-CM | POA: Diagnosis not present

## 2019-06-25 DIAGNOSIS — I87311 Chronic venous hypertension (idiopathic) with ulcer of right lower extremity: Secondary | ICD-10-CM | POA: Diagnosis not present

## 2019-06-25 DIAGNOSIS — L97812 Non-pressure chronic ulcer of other part of right lower leg with fat layer exposed: Secondary | ICD-10-CM | POA: Diagnosis not present

## 2019-06-25 NOTE — Progress Notes (Signed)
Jordan Nelson, Jordan L. ([id number]) Visit Report for 06/25/2019 Debridement Details Patient Name: Date of Service: Jordan Nelson, Jordan L. 06/25/2019 12:30 PM Medical Record [id number] Patient Account Number: [id number] Date of Birth/Sex: 1951/07/15 (68 y.o. M) Treating RN: Cherylin Mylarwiggins, Shannon Primary Care Provider: [facility], [place] Other Clinician: Referring Provider: [facility], [place] Treating Provider/Extender:, Lamar Sprinkles Weeks in Treatment: 3 Debridement Performed for Wound #1 Right,Anterior Lower Leg Assessment: Performed By: Physician Maxwell Caulobson,  G., MD Debridement Type: Debridement Severity of Tissue Pre Fat layer exposed Debridement: Level of Consciousness (Pre- Awake and Alert procedure): Pre-procedure Verification/Time Out Taken: Yes - 13:05 Start Time: 13:05 Pain Control: Other : benzocaine, 20% Total Area Debrided (L x W): 3.3 (cm) x 0.6 (cm) = 1.98 (cm) Tissue and other material Non-Viable, Eschar, Fibrin/Exudate debrided: Level: Non-Viable Tissue Debridement Description: Selective/Open Wound Instrument: Curette Bleeding: None End Time: 13:06 Procedural Pain: 0 Post Procedural Pain: 0 Response to Treatment: Procedure was tolerated well Level of Consciousness Awake and Alert (Post-procedure): Post Debridement Measurements of Total Wound Length: (cm) 3.3 Width: (cm) 0.6 Depth: (cm) 0.1 Volume: (cm) 0.156 Character of Wound/Ulcer Post Improved Debridement: Severity of Tissue Post Debridement: Fat layer exposed Post Procedure Diagnosis Same as Pre-procedure Electronic Signature(s) Signed: 06/25/2019 5:20:59 PM By: Cherylin Mylarwiggins, Shannon Signed: 06/25/2019 5:33:43 PM By: Baltazar Najjarobson,  MD Entered By: Baltazar Najjarobson,  on 06/25/2019 13:11:19 -------------------------------------------------------------------------------- HPI Details Patient Name: Date of Service: Jordan Nelson, Jordan L. 06/25/2019 12:30 PM Medical Record [id number] Patient Account  Number: [id number] Date of Birth/Sex: Treating RN: 1951/07/15 (68 y.o. Katherina RightM) Dwiggins, Shannon Primary Care Provider: [facility], [place] Other Clinician: Referring Provider: Treating Provider/Extender:, [facility], [place] Weeks in Treatment: 3 History of Present Illness HPI Description: ADMISSION 06/04/2019 This is a 68 year old man who is not a diabetic who traumatized his right mid tibial area moving furniture about 4 months ago. He has been using triple antibiotic about a month ago he hit it again while he was in the shower. He was seen in urgent care on 05/13/2019 I think they recommended the topical antibiotics no x-rays or cultures were done. He does have a history of wounds on his legs often with minor trauma especially on the right but also on the left. Past medical history tinea pedis, umbilical hernia, hypertension, gastroesophageal reflux disease, vitamin D deficiency Contact with TB treated in the 1980s. He states he was never ill ABI in the right was 0.92 11/6; traumatic wound in the setting of chronic venous insufficiency debridement last week using silver alginate. Wound is measuring smaller 11/13; traumatic wound in the setting of chronic venous insufficiency. We have been using silver alginate. Again he comes in with the same fibrinous surface as last time although most of this debrides off and there is only small open areas left. Changed to moistened collagen as the primary dressing Electronic Signature(s) Signed: 06/25/2019 5:33:43 PM By: Baltazar Najjarobson,  MD Entered By: Baltazar Najjarobson,  on 06/25/2019 13:12:00 -------------------------------------------------------------------------------- Physical Exam Details Patient Name: Date of Service: Jordan Nelson, Jordan L. 06/25/2019 12:30 PM Medical Record [id number] Patient Account Number: [id number] Date of Birth/Sex: Treating RN: 1951/07/15 (68 y.o. Katherina RightM) Dwiggins, Shannon Primary Care Provider: [facility],  [place] Other Clinician: Referring Provider: Treating Provider/Extender:, [facility], [place] Weeks in Treatment: 3 Constitutional Patient is hypertensive.. Pulse regular and within target range for patient.Marland Kitchen. Respirations regular, non-labored and within target range.. Temperature is normal and within the target range for the patient.Marland Kitchen. Appears in no distress. Notes Wound exam; the area in question is on the right mid tibia. Still eschar and debris on the wound surface  I removed this with a #5 curette similar to last week. There is only 2 small open areas remaining no evidence of infection his edema control is good Electronic Signature(s) Signed: 06/25/2019 5:33:43 PM By: Baltazar Najjar MD Entered By: Baltazar Najjar on 06/25/2019 13:12:40 -------------------------------------------------------------------------------- Physician Orders Details Patient Name: Date of Service: Jordan Adam 06/25/2019 12:30 PM Medical Record [id number] Patient Account Number: [id number] Date of Birth/Sex: Treating RN: [birth year] (68 y.o. [name] Primary Care Provider: [facility], [place] Other Clinician: Referring Provider: Treating Provider/Extender:, [facility], [place] Weeks in Treatment: 3 Verbal / Phone Orders: No Diagnosis Coding Follow-up Appointments Return Appointment in 1 week. Dressing Change Frequency Wound #1 Right,Anterior Lower Leg Do not change entire dressing for one week. Skin Barriers/Peri-Wound Care Wound #1 Right,Anterior Lower Leg Moisturizing lotion Wound Cleansing Wound #1 Right,Anterior Lower Leg May shower with protection. Primary Wound Dressing Wound #1 Right,Anterior Lower Leg Silver Collagen - moisten with hydrogel Secondary Dressing Wound #1 Right,Anterior Lower Leg Dry Gauze ABD pad Edema Control 3 Layer Compression System - Right Lower Extremity Avoid standing for long periods of time Elevate legs  to the level of the heart or above for 30 minutes daily and/or when sitting, a frequency of: - throughout the day Exercise regularly Electronic Signature(s) Signed: 06/25/2019 5:20:59 PM By: Cherylin Mylar Signed: 06/25/2019 5:33:43 PM By: Baltazar Najjar MD Entered By: Cherylin Mylar on 06/25/2019 13:10:29 -------------------------------------------------------------------------------- Problem List Details Patient Name: Date of Service: Jordan Adam 06/25/2019 12:30 PM Medical Record [id number] Patient Account Number: [id number] Date of Birth/Sex: Treating RN: 1950-12-17 (68 y.o. [name] Primary Care Provider: CLIN[facility], [place] Other Clinician: Referring Provider: Treating Provider/Extender:, Victorino December, [place] Weeks in Treatment: 3 Active Problems ICD-10 Evaluated Encounter Code Description Active Date Today Diagnosis L97.811 Non-pressure chronic ulcer of other part of right lower 06/04/2019 No Yes leg limited to breakdown of skin I87.321 Chronic venous hypertension (idiopathic) with 06/04/2019 No Yes inflammation of right lower extremity Inactive Problems Resolved Problems Electronic Signature(s) Signed: 06/25/2019 5:33:43 PM By: Baltazar Najjar MD Entered By: Baltazar Najjar on 06/25/2019 13:10:23 -------------------------------------------------------------------------------- Progress Note Details Patient Name: Date of Service: Jordan Adam 06/25/2019 12:30 PM Medical Record [id number] Patient Account Number: [id number] Date of Birth/Sex: Treating RN: [birth year] (68 y.o. [name] Primary Care Provider: CLIN[facility], [place] Other Clinician: Referring Provider: Treating Provider/Extender:, [facility], [place] Weeks in Treatment: 3 Subjective History of Present Illness (HPI) ADMISSION 06/04/2019 This is a 68 year old man who is not a diabetic who traumatized his right mid tibial  area moving furniture about 4 months ago. He has been using triple antibiotic about a month ago he hit it again while he was in the shower. He was seen in urgent care on 05/13/2019 I think they recommended the topical antibiotics no x-rays or cultures were done. He does have a history of wounds on his legs often with minor trauma especially on the right but also on the left. Past medical history tinea pedis, umbilical hernia, hypertension, gastroesophageal reflux disease, vitamin D deficiency Contact with TB treated in the 1980s. He states he was never ill ABI in the right was 0.92 11/6; traumatic wound in the setting of chronic venous insufficiency debridement last week using silver alginate. Wound is measuring smaller 11/13; traumatic wound in the setting of chronic venous insufficiency. We have been using silver alginate. Again he comes in with the same fibrinous surface as last time although most of this debrides off and there is only small open areas left. Changed to  moistened collagen as the primary dressing Objective Constitutional Patient is hypertensive.. Pulse regular and within target range for patient.Marland Kitchen Respirations regular, non-labored and within target range.. Temperature is normal and within the target range for the patient.Marland Kitchen Appears in no distress. Vitals Time Taken: 12:47 PM, Height: 71 in, Weight: 180 lbs, BMI: 25.1, Temperature: 98.5 F, Pulse: 75 bpm, Respiratory Rate: 18 breaths/min, Blood Pressure: 147/88 mmHg. General Notes: Wound exam; the area in question is on the right mid tibia. Still eschar and debris on the wound surface I removed this with a #5 curette similar to last week. There is only 2 small open areas remaining no evidence of infection his edema control is good Integumentary (Hair, Skin) Wound #1 status is Open. Original cause of wound was Trauma. The wound is located on the Right,Anterior Lower Leg. The wound measures 3.3cm length x 0.6cm width x 0.1cm  depth; 1.555cm^2 area and 0.156cm^3 volume. There is Fat Layer (Subcutaneous Tissue) Exposed exposed. There is no tunneling or undermining noted. There is a small amount of serosanguineous drainage noted. The wound margin is thickened. There is large (67-100%) pink granulation within the wound bed. There is a small (1-33%) amount of necrotic tissue within the wound bed including Adherent Slough. Assessment Active Problems ICD-10 Non-pressure chronic ulcer of other part of right lower leg limited to breakdown of skin Chronic venous hypertension (idiopathic) with inflammation of right lower extremity Procedures Wound #1 Pre-procedure diagnosis of Wound #1 is a Venous Leg Ulcer located on the Right,Anterior Lower Leg .Severity of Tissue Pre Debridement is: Fat layer exposed. There was a Selective/Open Wound Non-Viable Tissue Debridement with a total area of 1.98 sq cm performed by Ricard Dillon., MD. With the following instrument(s): Curette to remove Non-Viable tissue/material. Material removed includes Eschar and Fibrin/Exudate and after achieving pain control using Other (benzocaine, 20%). No specimens were taken. A time out was conducted at 13:05, prior to the start of the procedure. There was no bleeding. The procedure was tolerated well with a pain level of 0 throughout and a pain level of 0 following the procedure. Post Debridement Measurements: 3.3cm length x 0.6cm width x 0.1cm depth; 0.156cm^3 volume. Character of Wound/Ulcer Post Debridement is improved. Severity of Tissue Post Debridement is: Fat layer exposed. Post procedure Diagnosis Wound #1: Same as Pre-Procedure Pre-procedure diagnosis of Wound #1 is a Venous Leg Ulcer located on the Right,Anterior Lower Leg . There was a Three Layer Compression Therapy Procedure by Deon Pilling, RN. Post procedure Diagnosis Wound #1: Same as Pre-Procedure Plan Follow-up Appointments: Return Appointment in 1 week. Dressing Change  Frequency: Wound #1 Right,Anterior Lower Leg: Do not change entire dressing for one week. Skin Barriers/Peri-Wound Care: Wound #1 Right,Anterior Lower Leg: Moisturizing lotion Wound Cleansing: Wound #1 Right,Anterior Lower Leg: May shower with protection. Primary Wound Dressing: Wound #1 Right,Anterior Lower Leg: Silver Collagen - moisten with hydrogel Secondary Dressing: Wound #1 Right,Anterior Lower Leg: Dry Gauze ABD pad Edema Control: 3 Layer Compression System - Right Lower Extremity Avoid standing for long periods of time Elevate legs to the level of the heart or above for 30 minutes daily and/or when sitting, a frequency of: - throughout the day Exercise regularly 1. Change to moistened silver collagen on the wound area ABDs under 3 layer compression hopefully healed next week Electronic Signature(s) Signed: 06/25/2019 5:33:43 PM By: Linton Ham MD Entered By: Linton Ham on 06/25/2019 13:13:16 -------------------------------------------------------------------------------- SuperBill Details Patient Name: Date of Service: Siri Cole 06/25/2019 Medical Record [id number] Patient Account  Number: [id number] Date of Birth/Sex: Treating RN: Mar 31, 1951 (68 y.o. [name] Primary Care Provider: CLINIC, [place] Other Clinician: Referring Provider: Treating Provider/Extender:, Victorino December, [place] Weeks in Treatment: 3 Diagnosis Coding ICD-10 Codes Code Description [phone] Non-pressure chronic ulcer of other part of right lower leg limited to breakdown of skin I87.321 Chronic venous hypertension (idiopathic) with inflammation of right lower extremity Facility Procedures CPT4 Code Description: [id number] - DEBRIDE WOUND 1ST 20 SQ CM OR < ICD-10 Diagnosis Description L97.811 Non-pressure chronic ulcer of other part of right lower le skin Modifier: g limited to b Quantity: 1 reakdown of Physician Procedures CPT4 Code  Description: [id number] - WC PHYS DEBR WO ANESTH 20 SQ CM ICD-10 Diagnosis Description L97.811 Non-pressure chronic ulcer of other part of right lower leg skin Modifier: limited to bre Quantity: 1 akdown of Electronic Signature(s) Signed: 06/25/2019 5:33:43 PM By: Baltazar Najjar MD Entered By: Baltazar Najjar on 06/25/2019 13:13:29

## 2019-07-02 ENCOUNTER — Encounter (HOSPITAL_BASED_OUTPATIENT_CLINIC_OR_DEPARTMENT_OTHER): Payer: Medicare HMO | Admitting: Internal Medicine

## 2019-07-05 ENCOUNTER — Encounter (HOSPITAL_BASED_OUTPATIENT_CLINIC_OR_DEPARTMENT_OTHER): Payer: Medicare HMO | Admitting: Physician Assistant

## 2019-07-05 ENCOUNTER — Other Ambulatory Visit: Payer: Self-pay

## 2019-07-05 DIAGNOSIS — I87321 Chronic venous hypertension (idiopathic) with inflammation of right lower extremity: Secondary | ICD-10-CM | POA: Diagnosis not present

## 2019-07-05 DIAGNOSIS — I872 Venous insufficiency (chronic) (peripheral): Secondary | ICD-10-CM | POA: Diagnosis not present

## 2019-07-05 DIAGNOSIS — L97811 Non-pressure chronic ulcer of other part of right lower leg limited to breakdown of skin: Secondary | ICD-10-CM | POA: Diagnosis not present

## 2019-07-05 DIAGNOSIS — S81801A Unspecified open wound, right lower leg, initial encounter: Secondary | ICD-10-CM | POA: Diagnosis not present

## 2019-07-05 DIAGNOSIS — Z6825 Body mass index (BMI) 25.0-25.9, adult: Secondary | ICD-10-CM | POA: Diagnosis not present

## 2019-07-05 DIAGNOSIS — I10 Essential (primary) hypertension: Secondary | ICD-10-CM | POA: Diagnosis not present

## 2019-07-05 NOTE — Progress Notes (Signed)
ATTIKUS, BARTOSZEK ([id number]) Visit Report for 07/05/2019 Arrival Information Details Patient Name: Date of Service: Jordan Nelson, Jordan Nelson 07/05/2019 8:00 AM Medical Record [id number] Patient Account Number: [id number] Date of Birth/Sex: Treating RN: 1950/12/28 (68 y.o. [name] Primary Care Jaiquan Temme: CLINIC, [place] Other Clinician: Referring Angelis Gates: Treating Akiba Melfi/Extender:Stone III, [facility], [place] Weeks in Treatment: 4 Visit Information History Since Last Visit Added or deleted any medications: No Patient Arrived: Ambulatory Any new allergies or adverse reactions: No Arrival Time: 08:05 Had a fall or experienced change in No Accompanied By: self activities of daily living that may affect Transfer Assistance: None risk of falls: Patient Identification Verified: Yes Signs or symptoms of abuse/neglect since last No Secondary Verification Process Yes visito Completed: Hospitalized since last visit: No Patient Requires Transmission-Based No Implantable device outside of the clinic excluding No Precautions: cellular tissue based products placed in the center Patient Has Alerts: No since last visit: Has Dressing in Place as Prescribed: Yes Pain Present Now: No Electronic Signature(s) Signed: 07/05/2019 2:17:56 PM By: Cherylin Mylar Entered By: Cherylin Mylar on 07/05/2019 08:06:19 -------------------------------------------------------------------------------- Clinic Level of Care Assessment Details Patient Name: Date of Service: Jordan Nelson, Jordan Nelson 07/05/2019 8:00 AM Medical Record [id number] Patient Account Number: [id number] Date of Birth/Sex: Treating RN: 09/13/1950 (68 y.o. [name] Primary Care Sakari Alkhatib: CLINIC, [place] Other Clinician: Referring Avonelle Viveros: Treating Stanislaus Kaltenbach/Extender:Stone III, [facility], [place] Weeks in Treatment: 4 Clinic Level of Care Assessment Items TOOL 4 Quantity Score X -  Use when only an EandM is performed on FOLLOW-UP visit 1 0 ASSESSMENTS - Nursing Assessment / Reassessment X - Reassessment of Co-morbidities (includes updates in patient status) 1 10 X - Reassessment of Adherence to Treatment Plan 1 5 ASSESSMENTS - Wound and Skin Assessment / Reassessment X - Simple Wound Assessment / Reassessment - one wound 1 5 []  - Complex Wound Assessment / Reassessment - multiple wounds 0 []  - Dermatologic / Skin Assessment (not related to wound area) 0 ASSESSMENTS - Focused Assessment []  - Circumferential Edema Measurements - multi extremities 0 []  - Nutritional Assessment / Counseling / Intervention 0 X - Lower Extremity Assessment (monofilament, tuning fork, pulses) 1 5 []  - Peripheral Arterial Disease Assessment (using hand held doppler) 0 ASSESSMENTS - Ostomy and/or Continence Assessment and Care []  - Incontinence Assessment and Management 0 []  - Ostomy Care Assessment and Management (repouching, etc.) 0 PROCESS - Coordination of Care X - Simple Patient / Family Education for ongoing care 1 15 []  - Complex (extensive) Patient / Family Education for ongoing care 0 X - Staff obtains , Records, Test Results / Process Orders 1 10 []  - Staff telephones HHA, Nursing Homes / Clarify orders / etc 0 []  - Routine Transfer to another Facility (non-emergent condition) 0 []  - Routine Hospital Admission (non-emergent condition) 0 []  - New Admissions / / Ordering NPWT, Apligraf, etc. 0 []  - Emergency Hospital Admission (emergent condition) 0 X - Simple Discharge Coordination 1 10 []  - Complex (extensive) Discharge Coordination 0 PROCESS - Special Needs []  - Pediatric / Minor Patient Management 0 []  - Isolation Patient Management 0 []  - Hearing / Language / Visual special needs 0 []  - Assessment of Community assistance (transportation, D/C planning, etc.) 0 []  - Additional assistance / Altered mentation 0 []  - Support Surface(s) Assessment  (bed, cushion, seat, etc.) 0 INTERVENTIONS - Wound Cleansing / Measurement X - Simple Wound Cleansing - one wound 1 5 []  - Complex Wound Cleansing - multiple wounds 0 X - Wound  Imaging (photographs - any number of wounds) 1 5 []  - Wound Tracing (instead of photographs) 0 X - Simple Wound Measurement - one wound 1 5 []  - Complex Wound Measurement - multiple wounds 0 INTERVENTIONS - Wound Dressings []  - Small Wound Dressing one or multiple wounds 0 []  - Medium Wound Dressing one or multiple wounds 0 []  - Large Wound Dressing one or multiple wounds 0 []  - Application of Medications - topical 0 []  - Application of Medications - injection 0 INTERVENTIONS - Miscellaneous []  - External ear exam 0 []  - Specimen Collection (cultures, biopsies, blood, body fluids, etc.) 0 []  - Specimen(s) / Culture(s) sent or taken to Lab for analysis 0 []  - Patient Transfer (multiple staff / Nurse, adultHoyer Lift / Similar devices) 0 []  - Simple Staple / Suture removal (25 or less) 0 []  - Complex Staple / Suture removal (26 or more) 0 []  - Hypo / Hyperglycemic Management (close monitor of Blood Glucose) 0 []  - Ankle / Brachial Index (ABI) - do not check if billed separately 0 X - Vital Signs 1 5 Has the patient been seen at the hospital within the last three years: Yes Total Score: 80 Level Of Care: New/Established - Level 3 Electronic Signature(s) Signed: 07/05/2019 2:18:43 PM By: Zandra AbtsLynch, Shatara RN, BSN Entered By: Zandra AbtsLynch, Shatara on 07/05/2019 08:53:22 -------------------------------------------------------------------------------- Encounter Discharge Information Details Patient Name: Date of Service: [name], Thane L. 07/05/2019 8:00 AM Medical Record [id number] Patient Account Number: [id number] Date of Birth/Sex: Treating RN: 19-Oct-1950 (68 y.o. Katherina RightM) Dwiggins, Shannon Primary Care Aijah Lattner: CLINIC, [place] Other Clinician: Referring Bannon Giammarco: Treating Kameah Rawl[facility]ender[facility]  [place] Weeks in Treatment: 4 Encounter Discharge Information Items Discharge Condition: Stable Ambulatory Status: Ambulatory Discharge Destination: Home Transportation: Private Auto Accompanied By: other Schedule Follow-up Appointment: No Clinical Summary of Care: Patient Declined Electronic Signature(s) Signed: 07/05/2019 2:17:56 PM By: Cherylin Mylarwiggins, Shannon Entered By: Cherylin Mylarwiggins, Shannon on 07/05/2019 09:06:52 -------------------------------------------------------------------------------- Lower Extremity Assessment Details Patient Name: Date of Service: [name], Cohan L. 07/05/2019 8:00 AM Medical Record [id number] Patient Account Number: [id number] Date of Birth/Sex: Treating RN: 19-Oct-1950 (68 y.o. Katherina RightM) Dwiggins, Shannon Primary Care Jalen Daluz: CLINIC, [place] Other Clinician: Referring Cacey Willow: Treating Tavaris Eudy[facility]Extender[facility] [place] Weeks in Treatment: 4 Edema Assessment Assessed: [Left: No] [Right: No] Edema: [Left: Ye] [Right: s] Calf Left: Right: Point of Measurement: 47 cm From Medial Instep cm 37 cm Ankle Left: Right: Point of Measurement: 11 cm From Medial Instep cm 23.6 cm Vascular Assessment Pulses: Dorsalis Pedis Palpable: [Right:Yes] Electronic Signature(s) Signed: 07/05/2019 2:17:56 PM By: Cherylin Mylarwiggins, Shannon Entered By: Cherylin Mylarwiggins, Shannon on 07/05/2019 08:14:06 -------------------------------------------------------------------------------- Multi-Disciplinary Care Plan Details Patient Name: Date of Service: [name], Talmage L. 07/05/2019 8:00 AM Medical Record [id number] Patient Account Number: [id number] Date of Birth/Sex: Treating RN: 19-Oct-1950 (68 y.o. Elizebeth KollerM) Lynch, Shatara Primary Care Chyane Greer: CLINIC, [place] Other Clinician: Referring Brady Plant: Treating Limmie Schoenberg/Extender:Stone [facility], [facility], [place] Weeks in Treatment: 4 Active Inactive Electronic Signature(s) Signed: 07/05/2019 2:18:43 PM By:  Zandra AbtsLynch, Shatara RN, BSN Entered By: Zandra AbtsLynch, Shatara on 07/05/2019 08:52:42 -------------------------------------------------------------------------------- Pain Assessment Details Patient Name: Date of Service: [name], Stedman L. 07/05/2019 8:00 AM Medical Record [id number] Patient Account Number: [id number] Date of Birth/Sex: Treating RN: 19-Oct-1950 (68 y.o. Katherina RightM) Dwiggins, Shannon Primary Care Kashae Carstens: CLINIC, [place] Other Clinician: Referring Arlissa Monteverde: Treating Hyun Reali[facility]Extender[facility] [place] Weeks in Treatment: 4 Active Problems Location of Pain Severity and Description of Pain Patient Has Paino No Site Locations Pain Management and Medication Current Pain Management: Electronic Signature(s) Signed: 07/05/2019 2:17:56 PM By:  Dwiggins, Larene [place] Entered By: Kela Millin on 07/05/2019 08:13:53 -------------------------------------------------------------------------------- Patient/Caregiver Education Details Patient Name: Jordan Nelson 11/23/2020andnbsp8:00 Date of Service: AM Medical Record [id number] Number: Patient Account Number: [id number] Treating RN: Date of Birth/Gender: November 01, 1950 (68 y.o. [name]) Other Clinician: CLINIC, Treating Primary Care Physician: Hinton Rao Physician/Extender: CLINIC, Weeks in Treatment: 4 Referring Physician: [place] Education Assessment Education Provided To: Patient Education Topics Provided Venous: Methods: Explain/Verbal Responses: State content correctly Wound/Skin Impairment: Methods: Explain/Verbal Responses: State content correctly [employer]) Signed: 07/05/2019 2:18:43 PM By: Levan Hurst RN, BSN Entered By: Levan Hurst on 07/05/2019 08:17:43 -------------------------------------------------------------------------------- Wound Assessment Details Patient Name: Date of Service: Jordan Nelson 07/05/2019 8:00 AM Medical Record  [id number] Patient Account Number: [id number] Date of Birth/Sex: Treating RN: 14-Apr-1951 (68 y.o. Marvis Repress Primary Care Xena Propst: CLINIC, [place] Other Clinician: Referring Edward Trevino: Treating Liliyana Thobe/Extender:Stone III, [facility], [place] Weeks in Treatment: 4 Wound Status Wound Number: 1 Primary Etiology: Venous Leg Ulcer Wound Location: Right Lower Leg - Anterior Wound Status: Healed - Epithelialized Wounding Event: Trauma Comorbid History: Anemia Date Acquired: 02/10/2019 Weeks Of Treatment: 4 Clustered Wound: No Wound Measurements Length: (cm) 0 % Reduct Width: (cm) 0 % Reduct Depth: (cm) 0 Epitheli Area: (cm) 0 Tunneli Volume: (cm) 0 Undermi Wound Description Full Thickness Without Exposed Support Foul Od Classification: Structures Slough/ Wound Flat and Intact Margin: Exudate None Present Amount: Wound Bed Granulation Amount: None Present (0%) Necrotic Amount: None Present (0%) Fascia Fat Lay Tendon Muscle Joint E Bone Ex or After Cleansing: No Fibrino No Exposed Structure Exposed: No er (Subcutaneous Tissue) Exposed: No Exposed: No Exposed: No xposed: No posed: No ion in Area: 100% ion in Volume: 100% alization: Large (67-100%) ng: No ning: No Electronic Signature(s) Signed: 07/05/2019 2:17:56 PM By: Kela Millin Signed: 07/05/2019 2:18:43 PM By: Levan Hurst RN, BSN Entered By: Levan Hurst on 07/05/2019 08:49:21 -------------------------------------------------------------------------------- Vitals Details Patient Name: Date of Service: Jordan Nelson 07/05/2019 8:00 AM Medical Record [id number] Patient Account Number: [id number] Date of Birth/Sex: Treating RN: [birth year] (68 y.o. Marvis Repress Primary Care Lamarr Feenstra: CLINIC, [place] Other Clinician: Referring Alonzo Loving: Treating Dashon Mcintire/Extender:Stone III, [facility], [place] Weeks in Treatment: 4 Vital Signs Time Taken:  08:10 Temperature (F): 98.0 Height (in): 71 Pulse (bpm): 78 Weight (lbs): 180 Respiratory Rate (breaths/min): 18 Body Mass Index (BMI): 25.1 Blood Pressure (mmHg): 145/80 Reference Range: 80 - 120 mg / dl Electronic Signature(s) Signed: 07/05/2019 2:17:56 PM By: Kela Millin Entered By: Kela Millin on 07/05/2019 08:13:45

## 2019-07-20 NOTE — Progress Notes (Signed)
XAVIAR, LUNN ([id number]) Visit Report for 06/04/2019 Chief Complaint Document Details Patient Name: Date of Service: Jordan Nelson, Jordan Nelson 06/04/2019 2:45 PM Medical Record [id number] Patient Account Number: [id number] Date of Birth/Sex: Treating RN: 12-Aug-1951 (68 y.o. Jordan Nelson Primary Care Provider: CLINIC, [place] Other Clinician: Referring Provider: Treating Provider/Extender:Jordan Nelson, [facility], [place] Weeks in Treatment: 0 Information Obtained from: Patient Chief Complaint 06/04/2019; patient is here for review of a wound on the right anterior mid tibial area Electronic Signature(s) Signed: 06/04/2019 6:06:00 PM By: Jordan Ham MD Entered By: Jordan Nelson on 06/04/2019 16:52:34 -------------------------------------------------------------------------------- Debridement Details Patient Name: Date of Service: Jordan Nelson 06/04/2019 2:45 PM Medical Record [id number] Patient Account Number: [id number] Date of Birth/Sex: 10-17-1950 (68 y.o. M) Treating RN: Jordan Nelson Primary Care Provider: CLINIC, [place] Other Clinician: Referring Provider: CLINIC, [place] Treating Provider/Extender:Jordan Nelson, Jordan Nelson in Treatment: 0 Debridement Performed for Wound #1 Right,Anterior Lower Leg Assessment: Performed By: Physician Jordan Nelson., MD Debridement Type: Debridement Severity of Tissue Pre Fat layer exposed Debridement: Level of Consciousness (Pre- Awake and Alert procedure): Pre-procedure Verification/Time Out Taken: Yes - 16:21 Start Time: 16:21 Pain Control: Other : Benzocaine 20% Total Area Debrided (L x W): 7 (cm) x 1.5 (cm) = 10.5 (cm) Tissue and other material Viable, Non-Viable, Slough, Subcutaneous, Slough debrided: Level: Skin/Subcutaneous Tissue Debridement Description: Excisional Instrument: Curette Bleeding: Minimum Hemostasis Achieved: Pressure End Time: 16:23 Procedural Pain: 0 Post  Procedural Pain: 0 Response to Treatment: Procedure was tolerated well Level of Consciousness Responds to Painful Stimuli (Post-procedure): Post Debridement Measurements of Total Wound Length: (cm) 7 Width: (cm) 1.5 Depth: (cm) 0.1 Volume: (cm) 0.825 Character of Wound/Ulcer Post Improved Debridement: Severity of Tissue Post Debridement: Fat layer exposed Post Procedure Diagnosis Same as Pre-procedure Electronic Signature(s) Signed: 06/04/2019 5:59:58 PM By: Jordan Nelson Signed: 06/04/2019 6:06:00 PM By: Jordan Ham MD Entered By: Jordan Nelson on 06/04/2019 16:23:43 -------------------------------------------------------------------------------- HPI Details Patient Name: Date of Service: Jordan Nelson 06/04/2019 2:45 PM Medical Record [id number] Patient Account Number: [id number] Date of Birth/Sex: Treating RN: [birth year] (68 y.o. Jordan Nelson Primary Care Provider: CLINIC, [place] Other Clinician: Referring Provider: Treating Provider/Extender:Jordan Nelson, Jordan Nelson, [place] Weeks in Treatment: 0 History of Present Illness HPI Description: ADMISSION 06/04/2019 This is a 68 year old man who is not a diabetic who traumatized his right mid tibial area moving furniture about 4 months ago. He has been using triple antibiotic about a month ago he hit it again while he was in the shower. He was seen in urgent care on 05/13/2019 I think they recommended the topical antibiotics no x-rays or cultures were done. He does have a history of wounds on his legs often with minor trauma especially on the right but also on the left. Past medical history tinea pedis, umbilical hernia, hypertension, gastroesophageal reflux disease, vitamin D deficiency Contact with TB treated in the 1980s. He states he was never ill ABI in the right was 0.92 Electronic Signature(s) Signed: 06/04/2019 6:06:00 PM By: Jordan Ham MD Entered By: Jordan Nelson on  06/04/2019 16:54:12 -------------------------------------------------------------------------------- Physical Exam Details Patient Name: Date of Service: Jordan Nelson 06/04/2019 2:45 PM Medical Record [id number] Patient Account Number: [id number] Date of Birth/Sex: Treating RN: Apr 05, 1951 (68 y.o. Jordan Nelson Primary Care Provider: CLINIC, [place] Other Clinician: Referring Provider: Treating Provider/Extender:Jordan Nelson, [place] Weeks in Treatment: 0 Constitutional Sitting or standing Blood Pressure is within target range for patient.. Pulse regular and within target range for patient.Marland Kitchen Respirations regular, non-labored and within target range.Marland Kitchen  Temperature is normal and within the target range for the patient.Marland Kitchen Appears in no distress. Respiratory work of breathing is normal. Bilateral breath sounds are clear and equal in all lobes with no wheezes, rales or rhonchi.. Cardiovascular Heart rhythm and rate regular, without murmur or gallop.. Pedal pulses palpable and strong bilaterally.. Integumentary (Hair, Skin) Intense hemosiderin deposition in his bilateral anterior lower extremities compatible with chronic venous disease probably some degree of venous inflammation. Psychiatric appears at normal baseline. Notes Wound exam; the area in question is in the right mid tibia. Somewhat irregular wound with very adherent debris on the surface. Using a #5 curette a fairly aggressive debridement of necrotic debris from the wound surface hemostasis with direct pressure. I do not believe there is any evidence of infection. Significant hemosiderin deposition highly suggestive of chronic venous insufficiency Electronic Signature(s) Signed: 06/04/2019 6:06:00 PM By: Jordan Najjar MD Entered By: Jordan Nelson on 06/04/2019 17:12:18 -------------------------------------------------------------------------------- Physician Orders Details Patient  Name: Date of Service: Jordan Nelson 06/04/2019 2:45 PM Medical Record [id number] Patient Account Number: [id number] Date of Birth/Sex: Treating RN: 1950/12/05 (68 y.o. Jordan Nelson Primary Care Provider: CLINIC, [place] Other Clinician: Referring Provider: Treating Provider/Extender:Claire Bridge, [facility], [place] Weeks in Treatment: 0 Verbal / Phone Orders: No Diagnosis Coding Follow-up Appointments Return Appointment in 2 weeks. Nurse Visit: - 1 week for rewrap Dressing Change Frequency Wound #1 Right,Anterior Lower Leg Do not change entire dressing for one week. Skin Barriers/Peri-Wound Care Wound #1 Right,Anterior Lower Leg Moisturizing lotion Wound Cleansing Wound #1 Right,Anterior Lower Leg May shower with protection. Primary Wound Dressing Wound #1 Right,Anterior Lower Leg Calcium Alginate with Silver Secondary Dressing Wound #1 Right,Anterior Lower Leg Dry Gauze ABD pad Edema Control 3 Layer Compression System - Right Lower Extremity Avoid standing for long periods of time Elevate legs to the level of the heart or above for 30 minutes daily and/or when sitting, a frequency of: - throughout the day Exercise regularly Electronic Signature(s) Signed: 06/04/2019 5:59:58 PM By: Zandra Abts RN, Nelson Signed: 06/04/2019 6:06:00 PM By: Jordan Najjar MD Entered By: Zandra Abts on 06/04/2019 16:24:53 -------------------------------------------------------------------------------- Problem List Details Patient Name: Date of Service: Jordan Nelson 06/04/2019 2:45 PM Medical Record [id number] Patient Account Number: [id number] Date of Birth/Sex: Treating RN: [birth year] (68 y.o. Jordan Nelson Primary Care Provider: CLINIC, [place] Other Clinician: Referring Provider: Treating Provider/Extender:Jemal Miskell, [facility], [place] Weeks in Treatment: 0 Active Problems ICD-10 Evaluated Encounter Code Description Active  Date Today Diagnosis L97.811 Non-pressure chronic ulcer of other part of right lower 06/04/2019 No Yes leg limited to breakdown of skin I87.321 Chronic venous hypertension (idiopathic) with 06/04/2019 No Yes inflammation of right lower extremity Inactive Problems Resolved Problems Electronic Signature(s) Signed: 06/04/2019 6:06:00 PM By: Jordan Najjar MD Entered By: Jordan Nelson on 06/04/2019 16:50:19 -------------------------------------------------------------------------------- Progress Note Details Patient Name: Date of Service: Jordan Nelson 06/04/2019 2:45 PM Medical Record [id number] Patient Account Number: [id number] Date of Birth/Sex: Treating RN: 08-17-1950 (68 y.o. Jordan Nelson Primary Care Provider: CLINIC, [place] Other Clinician: Referring Provider: Treating Provider/Extender:Julis Haubner, Victorino December, [place] Weeks in Treatment: 0 Subjective Chief Complaint Information obtained from Patient 06/04/2019; patient is here for review of a wound on the right anterior mid tibial area History of Present Illness (HPI) ADMISSION 06/04/2019 This is a 68 year old man who is not a diabetic who traumatized his right mid tibial area moving furniture about 4 months ago. He has been using triple antibiotic about a month ago he hit it again while he was in  the shower. He was seen in urgent care on 05/13/2019 I think they recommended the topical antibiotics no x-rays or cultures were done. He does have a history of wounds on his legs often with minor trauma especially on the right but also on the left. Past medical history tinea pedis, umbilical hernia, hypertension, gastroesophageal reflux disease, vitamin D deficiency Contact with TB treated in the 1980s. He states he was never ill ABI in the right was 0.92 Patient History Information obtained from Patient. Allergies No Known Allergies Family History Hypertension - Mother, No family history of  Cancer, Diabetes, Heart Disease, Hereditary Spherocytosis, Kidney Disease, Lung Disease, Seizures, Stroke, Thyroid Problems, Tuberculosis. Social History Never smoker, Marital Status - Single, Alcohol Use - Never, Drug Use - No History, Caffeine Use - Daily. Medical History Eyes Denies history of Cataracts, Glaucoma, Optic Neuritis Ear/Nose/Mouth/Throat Denies history of Chronic sinus problems/congestion, Middle ear problems Hematologic/Lymphatic Patient has history of Anemia Denies history of Hemophilia, Human Immunodeficiency Virus, Lymphedema, Sickle Cell Disease Respiratory Denies history of Aspiration, Asthma, Chronic Obstructive Pulmonary Disease (COPD), Pneumothorax, Sleep Apnea, Tuberculosis Cardiovascular Denies history of Angina, Arrhythmia, Congestive Heart Failure, Coronary Artery Disease, Deep Vein Thrombosis, Hypertension, Hypotension, Myocardial Infarction, Peripheral Arterial Disease, Peripheral Venous Disease, Phlebitis, Vasculitis Gastrointestinal Denies history of Cirrhosis , Colitis, Crohnoos, Hepatitis A, Hepatitis B, Hepatitis C Endocrine Denies history of Type I Diabetes, Type II Diabetes Genitourinary Denies history of End Stage Renal Disease Immunological Denies history of Lupus Erythematosus, Raynaudoos, Scleroderma Integumentary (Skin) Denies history of History of Burn Musculoskeletal Denies history of Gout, Rheumatoid Arthritis, Osteoarthritis, Osteomyelitis Neurologic Denies history of Dementia, Neuropathy, Quadriplegia, Paraplegia, Seizure Disorder Oncologic Denies history of Received Chemotherapy, Received Radiation Psychiatric Denies history of Anorexia/bulimia, Confinement Anxiety Review of Systems (ROS) Constitutional Symptoms (General Health) Denies complaints or symptoms of Fatigue, Fever, Chills, Marked Weight Change. Eyes Denies complaints or symptoms of Dry Eyes, Vision Changes, Glasses / Contacts. Ear/Nose/Mouth/Throat Denies  complaints or symptoms of Chronic sinus problems or rhinitis. Respiratory Denies complaints or symptoms of Chronic or frequent coughs, Shortness of Breath. Cardiovascular Denies complaints or symptoms of Chest pain. Gastrointestinal Denies complaints or symptoms of Frequent diarrhea, Nausea, Vomiting. Endocrine Denies complaints or symptoms of Heat/cold intolerance. Genitourinary Denies complaints or symptoms of Frequent urination. Integumentary (Skin) Complains or has symptoms of Wounds. Musculoskeletal Denies complaints or symptoms of Muscle Pain, Muscle Weakness. Neurologic Denies complaints or symptoms of Numbness/parasthesias. Psychiatric Denies complaints or symptoms of Claustrophobia, Suicidal. Objective Constitutional Sitting or standing Blood Pressure is within target range for patient.. Pulse regular and within target range for patient.Marland Kitchen Respirations regular, non-labored and within target range.. Temperature is normal and within the target range for the patient.Marland Kitchen Appears in no distress. Vitals Time Taken: 3:38 PM, Height: 71 in, Source: Stated, Weight: 180 lbs, BMI: 25.1, Temperature: 98.6 F, Pulse: 71 bpm, Respiratory Rate: 18 breaths/min, Blood Pressure: 133/72 mmHg. Respiratory work of breathing is normal. Bilateral breath sounds are clear and equal in all lobes with no wheezes, rales or rhonchi.. Cardiovascular Heart rhythm and rate regular, without murmur or gallop.. Pedal pulses palpable and strong bilaterally.Marland Kitchen Psychiatric appears at normal baseline. General Notes: Wound exam; the area in question is in the right mid tibia. Somewhat irregular wound with very adherent debris on the surface. Using a #5 curette a fairly aggressive debridement of necrotic debris from the wound surface hemostasis with direct pressure. I do not believe there is any evidence of infection. Significant hemosiderin deposition highly suggestive of chronic venous  insufficiency Integumentary (Hair,  Skin) Intense hemosiderin deposition in his bilateral anterior lower extremities compatible with chronic venous disease probably some degree of venous inflammation. Wound #1 status is Open. Original cause of wound was Trauma. The wound is located on the Right,Anterior Lower Leg. The wound measures 7cm length x 1.5cm width x 0.1cm depth; 8.247cm^2 area and 0.825cm^3 volume. There is Fat Layer (Subcutaneous Tissue) Exposed exposed. There is no tunneling or undermining noted. There is a medium amount of serosanguineous drainage noted. There is medium (34-66%) pink, pale granulation within the wound bed. There is a medium (34-66%) amount of necrotic tissue within the wound bed including Adherent Slough. Assessment Active Problems ICD-10 Non-pressure chronic ulcer of other part of right lower leg limited to breakdown of skin Chronic venous hypertension (idiopathic) with inflammation of right lower extremity Procedures Wound #1 Pre-procedure diagnosis of Wound #1 is a Venous Leg Ulcer located on the Right,Anterior Lower Leg .Severity of Tissue Pre Debridement is: Fat layer exposed. There was a Excisional Skin/Subcutaneous Tissue Debridement with a total area of 10.5 sq cm performed by Maxwell Caul., MD. With the following instrument(s): Curette to remove Viable and Non-Viable tissue/material. Material removed includes Subcutaneous Tissue and Slough and after achieving pain control using Other (Benzocaine 20%). No specimens were taken. A time out was conducted at 16:21, prior to the start of the procedure. A Minimum amount of bleeding was controlled with Pressure. The procedure was tolerated well with a pain level of 0 throughout and a pain level of 0 following the procedure. Post Debridement Measurements: 7cm length x 1.5cm width x 0.1cm depth; 0.825cm^3 volume. Character of Wound/Ulcer Post Debridement is improved. Severity of Tissue Post Debridement is: Fat  layer exposed. Post procedure Diagnosis Wound #1: Same as Pre-Procedure Pre-procedure diagnosis of Wound #1 is a Venous Leg Ulcer located on the Right,Anterior Lower Leg . There was a Three Layer Compression Therapy Procedure by Zandra Abts, RN. Post procedure Diagnosis Wound #1: Same as Pre-Procedure Plan Follow-up Appointments: Return Appointment in 2 weeks. Nurse Visit: - 1 week for rewrap Dressing Change Frequency: Wound #1 Right,Anterior Lower Leg: Do not change entire dressing for one week. Skin Barriers/Peri-Wound Care: Wound #1 Right,Anterior Lower Leg: Moisturizing lotion Wound Cleansing: Wound #1 Right,Anterior Lower Leg: May shower with protection. Primary Wound Dressing: Wound #1 Right,Anterior Lower Leg: Calcium Alginate with Silver Secondary Dressing: Wound #1 Right,Anterior Lower Leg: Dry Gauze ABD pad Edema Control: 3 Layer Compression System - Right Lower Extremity Avoid standing for long periods of time Elevate legs to the level of the heart or above for 30 minutes daily and/or when sitting, a frequency of: - throughout the day Exercise regularly 1. I applied silver alginate ABDs under 3 layer compression. My plan is to leave this in place all week help control the swelling. 2. I did not feel there was any additional infection 3. No evidence of arterial issues however he does have chronic venous insufficiency and there is evidence of other healed wounds bilaterally. I did not order reflux studies. He has not even attempted to wear stockings. I will look to heal this wound and discharge him with compression stockings. Electronic Signature(s) Signed: 06/04/2019 6:06:00 PM By: Jordan Najjar MD Entered By: Jordan Nelson on 06/04/2019 17:13:22 -------------------------------------------------------------------------------- HxROS Details Patient Name: Date of Service: Jordan Nelson 06/04/2019 2:45 PM Medical Record [id number] Patient Account  Number: [id number] Date of Birth/Sex: Treating RN: 1950-11-14 (68 y.o. Judie Petit) Yevonne Pax Primary Care Provider: CLINIC, [place] Other Clinician: Referring Provider: Treating Provider/Extender:Brieanne Mignone, [facility],  [place] Weeks in Treatment: 0 Information Obtained From Patient Constitutional Symptoms (General Health) Complaints and Symptoms: Negative for: Fatigue; Fever; Chills; Marked Weight Change Eyes Complaints and Symptoms: Negative for: Dry Eyes; Vision Changes; Glasses / Contacts Medical History: Negative for: Cataracts; Glaucoma; Optic Neuritis Ear/Nose/Mouth/Throat Complaints and Symptoms: Negative for: Chronic sinus problems or rhinitis Medical History: Negative for: Chronic sinus problems/congestion; Middle ear problems Respiratory Complaints and Symptoms: Negative for: Chronic or frequent coughs; Shortness of Breath Medical History: Negative for: Aspiration; Asthma; Chronic Obstructive Pulmonary Disease (COPD); Pneumothorax; Sleep Apnea; Tuberculosis Cardiovascular Complaints and Symptoms: Negative for: Chest pain Medical History: Negative for: Angina; Arrhythmia; Congestive Heart Failure; Coronary Artery Disease; Deep Vein Thrombosis; Hypertension; Hypotension; Myocardial Infarction; Peripheral Arterial Disease; Peripheral Venous Disease; Phlebitis; Vasculitis Gastrointestinal Complaints and Symptoms: Negative for: Frequent diarrhea; Nausea; Vomiting Medical History: Negative for: Cirrhosis ; Colitis; Crohns; Hepatitis A; Hepatitis B; Hepatitis C Endocrine Complaints and Symptoms: Negative for: Heat/cold intolerance Medical History: Negative for: Type I Diabetes; Type II Diabetes Genitourinary Complaints and Symptoms: Negative for: Frequent urination Medical History: Negative for: End Stage Renal Disease Integumentary (Skin) Complaints and Symptoms: Positive for: Wounds Medical History: Negative for: History of  Burn Musculoskeletal Complaints and Symptoms: Negative for: Muscle Pain; Muscle Weakness Medical History: Negative for: Gout; Rheumatoid Arthritis; Osteoarthritis; Osteomyelitis Neurologic Complaints and Symptoms: Negative for: Numbness/parasthesias Medical History: Negative for: Dementia; Neuropathy; Quadriplegia; Paraplegia; Seizure Disorder Psychiatric Complaints and Symptoms: Negative for: Claustrophobia; Suicidal Medical History: Negative for: Anorexia/bulimia; Confinement Anxiety Hematologic/Lymphatic Medical History: Positive for: Anemia Negative for: Hemophilia; Human Immunodeficiency Virus; Lymphedema; Sickle Cell Disease Immunological Medical History: Negative for: Lupus Erythematosus; Raynauds; Scleroderma Oncologic Medical History: Negative for: Received Chemotherapy; Received Radiation Immunizations Pneumococcal Vaccine: Received Pneumococcal Vaccination: No Implantable Devices None Family and Social History Cancer: No; Diabetes: No; Heart Disease: No; Hereditary Spherocytosis: No; Hypertension: Yes - Mother; Kidney Disease: No; Lung Disease: No; Seizures: No; Stroke: No; Thyroid Problems: No; Tuberculosis: No; Never smoker; Marital Status - Single; Alcohol Use: Never; Drug Use: No History; Caffeine Use: Daily; Financial Concerns: No; Food, Clothing or Shelter Needs: No; Support System Lacking: No; Transportation Concerns: No Psychologist, prison and probation services) Signed: 06/04/2019 6:06:00 PM By: Jordan Najjar MD Signed: 07/20/2019 3:01:15 PM By: Yevonne Pax RN Entered By: Yevonne Pax on 06/04/2019 15:33:28 -------------------------------------------------------------------------------- SuperBill Details Patient Name: Date of Service: Jordan Nelson 06/04/2019 Medical Record [id number] Patient Account Number: [id number] Date of Birth/Sex: Treating RN: 11/28/1950 (68 y.o. Jordan Nelson Primary Care Provider: CLINIC, [place] Other  Clinician: Referring Provider: Treating Provider/Extender:Kayona Foor, Victorino December, [place] Weeks in Treatment: 0 Diagnosis Coding ICD-10 Codes Code Description [id number] Non-pressure chronic ulcer of other part of right lower leg limited to breakdown of skin I87.321 Chronic venous hypertension (idiopathic) with inflammation of right lower extremity Facility Procedures CPT4 Code Description: [id number] 99213 - WOUND CARE VISIT-LEV 3 EST PT Modifier: 25 Quantity: 1 CPT4 Code Description: [id number] 11042 - DEB SUBQ TISSUE 20 SQ CM/< ICD-10 Diagnosis Description L97.811 Non-pressure chronic ulcer of other part of right lower le skin Modifier: g limited to b Quantity: 1 reakdown of Physician Procedures CPT4 Code Description: [id number] WC PHYS LEVEL 3 NEW PT ICD-10 Diagnosis Description L97.811 Non-pressure chronic ulcer of other part of right lower l skin I87.321 Chronic venous hypertension (idiopathic) with inflammatio extremity Modifier: 25 eg limited to bre n of right lower Quantity: 1 akdown of CPT4 Code Description: [id number] 11042 - WC PHYS SUBQ TISS 20 SQ CM ICD-10 Diagnosis Description L97.811 Non-pressure chronic ulcer of other part of right lower l  skin Modifier: eg limited to bre Quantity: 1 akdown of Electronic Signature(s) Signed: 06/04/2019 5:59:58 PM By: Zandra AbtsLynch, Shatara RN, Nelson Signed: 06/04/2019 6:06:00 PM By: Jordan Najjarobson, Trampus Mcquerry MD Entered By: Zandra AbtsLynch, Shatara on 06/04/2019 17:49:27

## 2019-07-20 NOTE — Progress Notes (Signed)
KINSEY, COWSERT ([id number]) Visit Report for 06/04/2019 Allergy List Details Patient Name: Date of Service: Jordan Nelson, Jordan Nelson 06/04/2019 2:45 PM Medical Record [id number] Patient Account Number: [id number] Date of Birth/Sex: Treating RN: 27-Dec-1950 (68 y.o. Jordan Nelson) Yevonne Pax Primary Care Malikye Reppond: CLINIC, [place] Other Clinician: Referring Jacyln Carmer: Treating Bronsyn Shappell/Extender:Robson, Victorino December, [place] Weeks in Treatment: 0 Allergies Active Allergies No Known Allergies Allergy Notes Electronic Signature(s) Signed: 07/20/2019 3:01:15 PM By: Yevonne Pax RN Entered By: Yevonne Pax on 06/04/2019 15:29:24 -------------------------------------------------------------------------------- Arrival Information Details Patient Name: Date of Service: Jordan Nelson 06/04/2019 2:45 PM Medical Record [id number] Patient Account Number: [id number] Date of Birth/Sex: Treating RN: 1951/02/21 (68 y.o. Jordan Nelson) Yevonne Pax Primary Care Jyrah Blye: CLINIC, [place] Other Clinician: Referring Konnie Noffsinger: Treating Cambridge Deleo/Extender:Robson, Victorino December, [place] Weeks in Treatment: 0 Visit Information Patient Arrived: Ambulatory Arrival Time: 15:27 Accompanied By: self Transfer Assistance: None Patient Identification Verified: Yes Secondary Verification Process Completed: Yes Patient Requires Transmission-Based No Precautions: Patient Has Alerts: No Electronic Signature(s) Signed: 07/20/2019 3:01:15 PM By: Yevonne Pax RN Entered By: Yevonne Pax on 06/04/2019 15:28:21 -------------------------------------------------------------------------------- Clinic Level of Care Assessment Details Patient Name: Date of Service: [name], [name] 06/04/2019 2:45 PM Medical Record [id number] Patient Account Number: [id number] Date of Birth/Sex: Treating RN: 1951/01/19 (68 y.o. Jordan Nelson Primary Care Garrie Woodin: CLINIC, [place] Other  Clinician: Referring Katalia Choma: Treating Raman Featherston/Extender:Robson, [facility], [place] Weeks in Treatment: 0 Clinic Level of Care Assessment Items TOOL 1 Quantity Score X - Use when EandM and Procedure is performed on INITIAL visit 1 0 ASSESSMENTS - Nursing Assessment / Reassessment X - General Physical Exam (combine w/ comprehensive assessment (listed just below) 1 20 when performed on new pt. evals) X - Comprehensive Assessment (HX, ROS, Risk Assessments, Wounds Hx, etc.) 1 25 ASSESSMENTS - Wound and Skin Assessment / Reassessment  - Dermatologic / Skin Assessment (not related to wound area) 0 ASSESSMENTS - Ostomy and/or Continence Assessment and Care  - Incontinence Assessment and Management 0  - Ostomy Care Assessment and Management (repouching, etc.) 0 PROCESS - Coordination of Care X - Simple Patient / Family Education for ongoing care 1 15  - Complex (extensive) Patient / Family Education for ongoing care 0 X - Staff obtains Chiropractor, Records, Test Results / Process Orders 1 10  - Staff telephones HHA, Nursing Homes / Clarify orders / etc 0  - Routine Transfer to another Facility (non-emergent condition) 0  - Routine Hospital Admission (non-emergent condition) 0 X - New Admissions / Manufacturing engineer / Ordering NPWT, Apligraf, etc. 1 15  - Emergency Hospital Admission (emergent condition) 0 PROCESS - Special Needs  - Pediatric / Minor Patient Management 0  - Isolation Patient Management 0  - Hearing / Language / Visual special needs 0  - Assessment of Community assistance (transportation, D/C planning, etc.) 0  - Additional assistance / Altered mentation 0  - Support Surface(s) Assessment (bed, cushion, seat, etc.) 0 INTERVENTIONS - Miscellaneous  - External ear exam 0  - Patient Transfer (multiple staff / Nurse, adult / Similar devices) 0  - Simple Staple / Suture removal (25 or less) 0  - Complex Staple / Suture removal  (26 or more) 0  - Hypo/Hyperglycemic Management (do not check if billed separately) 0 X - Ankle / Brachial Index (ABI) - do not check if billed separately 1 15 Has the patient been seen at the hospital within the last three years: Yes Total Score: 100 Level Of Care: New/Established - Level 3 Electronic Signature(s) Signed: 06/04/2019 5:59:58 PM  By: Zandra AbtsLynch, Shatara RN, BSN Entered By: Zandra AbtsLynch, Shatara on 06/04/2019 16:26:42 -------------------------------------------------------------------------------- Compression Therapy Details Patient Name: Date of Service: Jordan AdamHIGPEN, Jordan L. 06/04/2019 2:45 PM Medical Record [id number] Patient Account Number: [id number] Date of Birth/Sex: Treating RN: 10/01/1950 (68 y.o. Jordan KollerM) Lynch, Shatara Primary Care Vani Gunner: CLINIC, [place] Other Clinician: Referring Tayona Sarnowski: Treating Patience Nuzzo/Extender:Robson, [facility] [place] Weeks in Treatment: 0 Compression Therapy Performed for Wound Wound #1 Right,Anterior Lower Leg Assessment: Performed By: Clinician Zandra AbtsLynch, Shatara, RN Compression Type: Three Layer Post Procedure Diagnosis Same as Pre-procedure Electronic Signature(s) Signed: 06/04/2019 5:59:58 PM By: Zandra AbtsLynch, Shatara RN, BSN Entered By: Zandra AbtsLynch, Shatara on 06/04/2019 16:25:20 -------------------------------------------------------------------------------- Encounter Discharge Information Details Patient Name: Date of Service: Jordan AdamHIGPEN, Jordan L. 06/04/2019 2:45 PM Medical Record [id number] Patient Account Number: [id number] Date of Birth/Sex: Treating RN: 10/01/1950 (68 y.o. Katherina RightM) Dwiggins, Shannon Primary Care Zekiah Caruth: CLINIC, [place] Other Clinician: Referring Dajohn Ellender: Treating Rhetta Cleek/Extender:Robson, Victorino DecemberMichael CLINIC, [place] Weeks in Treatment: 0 Encounter Discharge Information Items Post Procedure Vitals Discharge Condition: Stable Temperature (F): 98.6 Ambulatory Status: Ambulatory Pulse (bpm):  71 Discharge Destination: Home Respiratory Rate (breaths/min): 18 Transportation: Other Blood Pressure (mmHg): 133/72 Accompanied By: self Schedule Follow-up Appointment: Yes Clinical Summary of Care: Patient Declined Electronic Signature(s) Signed: 06/07/2019 5:21:50 PM By: Cherylin Mylarwiggins, Shannon Entered By: Cherylin Mylarwiggins, Shannon on 06/04/2019 17:01:02 -------------------------------------------------------------------------------- Lower Extremity Assessment Details Patient Name: Date of Service: Jordan AdamHIGPEN, Keino L. 06/04/2019 2:45 PM Medical Record [id number] Patient Account Number: [id number] Date of Birth/Sex: Treating RN: 10/01/1950 (68 y.o. Jordan PetitM) Yevonne PaxEpps, Carrie Primary Care Carneshia Raker: CLINIC, [place] Other Clinician: Referring Eria Lozoya: Treating Carsten Carstarphen/Extender:Robson, Victorino DecemberMichael CLINIC, [place] Weeks in Treatment: 0 Edema Assessment Assessed: [Left: No] [Right: Yes] Edema: [Left: Ye] [Right: s] Calf Left: Right: Point of Measurement: 47 cm From Medial Instep cm 39 cm Ankle Left: Right: Point of Measurement: 11 cm From Medial Instep cm 24 cm Vascular Assessment Blood Pressure: Brachial: [Right:133] Ankle: [Right:Dorsalis Pedis: 122 0.92] Electronic Signature(s) Signed: 07/20/2019 3:01:15 PM By: Yevonne PaxEpps, Carrie RN Entered By: Yevonne PaxEpps, Carrie on 06/04/2019 15:49:38 -------------------------------------------------------------------------------- Multi Wound Chart Details Patient Name: Date of Service: Jordan AdamHIGPEN, Koleson L. 06/04/2019 2:45 PM Medical Record [id number] Patient Account Number: [id number] Date of Birth/Sex: Treating RN: 10/01/1950 (68 y.o. Jordan KollerM) Lynch, Shatara Primary Care Taysom Glymph: CLINIC, [place] Other Clinician: Referring Sadonna Kotara: Treating Lanny Donoso/Extender:Robson, [facility], [place] Weeks in Treatment: 0 Vital Signs Height(in): 71 Pulse(bpm): 71 Weight(lbs): 180 Blood Pressure(mmHg): 133/72 Body Mass Index(BMI): 25 Temperature(F):  98.6 Respiratory 18 Rate(breaths/min): Photos: [1:No Photos] [N/A:N/A] Wound Location: [1:Right Lower Leg] [N/A:N/A] Wounding Event: [1:Trauma] [N/A:N/A] Primary Etiology: [1:Inflammatory] [N/A:N/A] Comorbid History: [1:Anemia] [N/A:N/A] Date Acquired: [1:02/10/2019] [N/A:N/A] Weeks of Treatment: [1:0] [N/A:N/A] Wound Status: [1:Open] [N/A:N/A] Measurements L x W x D [1:7x1.5x0.1] [N/A:N/A] (cm) Area (cm) : [1:8.247] [N/A:N/A] Volume (cm) : [1:0.825] [N/A:N/A] Classification: [1:Full Thickness Without Exposed Support Structures] [N/A:N/A] Exudate Amount: [1:Medium] [N/A:N/A] Exudate Type: [1:Serosanguineous] [N/A:N/A] Exudate Color: [1:red, brown] [N/A:N/A] Granulation Amount: [1:Medium (34-66%)] [N/A:N/A] Granulation Quality: [1:Pink, Pale] [N/A:N/A] Necrotic Amount: [1:Medium (34-66%)] [N/A:N/A] Exposed Structures: [1:Fat Layer (Subcutaneous N/A Tissue) Exposed: Yes Fascia: No Tendon: No Muscle: No Joint: No Bone: No] Epithelialization: [1:None] [N/A:N/A] Debridement: [1:Debridement - Excisional] [N/A:N/A] Pre-procedure [1:16:21] [N/A:N/A] Verification/Time Out Taken: Pain Control: [1:Other] [N/A:N/A] Tissue Debrided: [1:Subcutaneous, Slough] [N/A:N/A] Level: [1:Skin/Subcutaneous Tissue] [N/A:N/A] Debridement Area (sq cm):10.5 [N/A:N/A] Instrument: [1:Curette] [N/A:N/A] Bleeding: [1:Minimum] [N/A:N/A] Hemostasis Achieved: [1:Pressure] [N/A:N/A] Procedural Pain: [1:0] [N/A:N/A] Post Procedural Pain: [1:0] [N/A:N/A] Debridement Treatment Procedure was tolerated [N/A:N/A] Response: [1:well] Post Debridement [1:7x1.5x0.1] [N/A:N/A] Measurements L x W x D (cm) Post Debridement [1:0.825] [N/A:N/A] Volume: (cm)  Procedures Performed: Compression Therapy [1:Debridement] [N/A:N/A] Treatment Notes Electronic Signature(s) Signed: 06/04/2019 5:59:58 PM By: Zandra Abts RN, BSN Signed: 06/04/2019 6:06:00 PM By: Baltazar Najjar MD Entered By: Baltazar Najjar on 06/04/2019  16:50:52 -------------------------------------------------------------------------------- Multi-Disciplinary Care Plan Details Patient Name: Date of Service: LAVONTE, PALOS 06/04/2019 2:45 PM Medical Record [id number] Patient Account Number: [id number] Date of Birth/Sex: Treating RN: 1951-03-14 (68 y.o. Jordan Nelson Primary Care Manna Gose: CLINIC, [place] Other Clinician: Referring Wana Mount: Treating Iviana Blasingame/Extender:Robson, [facility], [place] Weeks in Treatment: 0 Active Inactive Venous Leg Ulcer Nursing Diagnoses: Knowledge deficit related to disease process and management Potential for venous Insuffiency (use before diagnosis confirmed) Goals: Patient will maintain optimal edema control Date Initiated: 06/04/2019 Target Resolution Date: 07/02/2019 Goal Status: Active Patient/caregiver will verbalize understanding of disease process and disease management Date Initiated: 06/04/2019 Target Resolution Date: 07/02/2019 Goal Status: Active Interventions: Assess peripheral edema status every visit. Compression as ordered Provide education on venous insufficiency Notes: Wound/Skin Impairment Nursing Diagnoses: Impaired tissue integrity Knowledge deficit related to ulceration/compromised skin integrity Goals: Patient/caregiver will verbalize understanding of skin care regimen Date Initiated: 06/04/2019 Target Resolution Date: 07/02/2019 Goal Status: Active Ulcer/skin breakdown will have a volume reduction of 30% by week 4 Date Initiated: 06/04/2019 Target Resolution Date: 07/02/2019 Goal Status: Active Interventions: Assess patient/caregiver ability to obtain necessary supplies Assess patient/caregiver ability to perform ulcer/skin care regimen upon admission and as needed Assess ulceration(s) every visit Provide education on ulcer and skin care Notes: Electronic Signature(s) Signed: 06/04/2019 5:59:58 PM By: Zandra Abts RN, BSN Entered  By: Zandra Abts on 06/04/2019 16:22:39 -------------------------------------------------------------------------------- Pain Assessment Details Patient Name: Date of Service: Jordan Nelson 06/04/2019 2:45 PM Medical Record [id number] Patient Account Number: [id number] Date of Birth/Sex: Treating RN: 1951-02-19 (68 y.o. Jordan Nelson) Yevonne Pax Primary Care Modelle Vollmer: CLINIC, [place] Other Clinician: Referring Zyquan Crotty: Treating Kashden Deboy/Extender:Robson, Victorino December, [place] Weeks in Treatment: 0 Active Problems Location of Pain Severity and Description of Pain Patient Has Paino Yes Site Locations With Dressing Change: Yes Duration of the Pain. Constant / Intermittento Intermittent Rate the pain. Current Pain Level: 8 Worst Pain Level: 10 Least Pain Level: 0 Tolerable Pain Level: 5 Character of Pain Describe the Pain: Aching, Throbbing Pain Management and Medication Current Pain Management: Medication: Yes Cold Application: No Rest: Yes Massage: No Activity: No T.E.N.S.: No Heat Application: No Leg drop or elevation: No Is the Current Pain Management Adequate: Inadequate How does your wound impact your activities of daily livingo Sleep: Yes Bathing: No Appetite: No Relationship With Others: No Bladder Continence: No Emotions: No Bowel Continence: No Work: No Toileting: No Drive: No Dressing: No Hobbies: No Electronic Signature(s) Signed: 07/20/2019 3:01:15 PM By: Yevonne Pax RN Entered By: Yevonne Pax on 06/04/2019 15:52:34 -------------------------------------------------------------------------------- Patient/Caregiver Education Details Patient Name: Date of Service: Jordan Nelson 10/23/2020andnbsp2:45 PM Medical Record [id number] Patient Account Number: [id number] Date of Birth/Gender: Treating RN: [birth year] (68 y.o. Jordan Nelson Primary Care Physician: CLINIC, [place] Other Clinician: Referring Physician: Treating  Physician/Extender:Robson, Victorino December, [place] Weeks in Treatment: 0 Education Assessment Education Provided To: Patient Education Topics Provided Venous: Handouts: Controlling Swelling with Compression Stockings , Managing Venous Disease and Related Ulcers Methods: Explain/Verbal Responses: State content correctly Wound/Skin Impairment: Methods: Explain/Verbal Responses: State content correctly Electronic Signature(s) Signed: 06/04/2019 5:59:58 PM By: Zandra Abts RN, BSN Entered By: Zandra Abts on 06/04/2019 16:26:16 -------------------------------------------------------------------------------- Wound Assessment Details Patient Name: Date of Service: Jordan Nelson 06/04/2019 2:45 PM Medical Record [id number] Patient Account Number: [id number] Date of Birth/Sex: Treating RN: Feb 15, 1951 (  68 y.o. M) Epps, Morey Hummingbird Primary Care Maram Bently: CLINIC, [place] Other Clinician: Referring Xandra Laramee: Treating Sherral Dirocco/Extender:Robson, Linus Galas, [place] Weeks in Treatment: 0 Wound Status Wound Number: 1 Primary Etiology: Venous Leg Ulcer Wound Location: Right Lower Leg - Anterior Wound Status: Open Wounding Event: Trauma Comorbid History: Anemia Date Acquired: 02/10/2019 Weeks Of Treatment: 0 Clustered Wound: No Photos Wound Measurements Length: (cm) 7 % Reductio Width: (cm) 1.5 % Reductio Depth: (cm) 0.1 Epithelial Area: (cm) 8.247 Tunneling Volume: (cm) 0.825 Undermini Wound Description Classification: Full Thickness Without Exposed Support Foul Od Structures Slough/ Exudate Medium Amount: Exudate Serosanguineous Type: Exudate red, brown Color: Wound Bed Granulation Amount: Medium (34-66%) Granulation Quality: Pink, Pale Fascia Necrotic Amount: Medium (34-66%) Fat Lay Necrotic Quality: Adherent Slough Tendon Muscle Joint E Bone Ex or After Cleansing: No Fibrino Yes Exposed Structure Exposed: No er (Subcutaneous Tissue)  Exposed: Yes Exposed: No Exposed: No xposed: No posed: No n in Area: 0% n in Volume: 0% ization: None : No ng: No Electronic Signature(s) Signed: 06/07/2019 4:53:58 PM By: Mikeal Hawthorne EMT/HBOT Signed: 07/20/2019 3:01:15 PM By: Carlene Coria RN Entered By: Mikeal Hawthorne on 06/07/2019 08:45:58 -------------------------------------------------------------------------------- Vitals Details Patient Name: Date of Service: Siri Cole 06/04/2019 2:45 PM Medical Record [id number] Patient Account Number: [id number] Date of Birth/Sex: Treating RN: 1950/09/29 (68 y.o. Jerilynn Mages) Carlene Coria Primary Care Carisa Backhaus: CLINIC, [place] Other Clinician: Referring Bernerd Terhune: Treating Kathalene Sporer/Extender:Robson, Linus Galas, [place] Weeks in Treatment: 0 Vital Signs Time Taken: 15:38 Temperature (F): 98.6 Height (in): 71 Pulse (bpm): 71 Source: Stated Respiratory Rate (breaths/min): 18 Weight (lbs): 180 Blood Pressure (mmHg): 133/72 Body Mass Index (BMI): 25.1 Reference Range: 80 - 120 mg / dl Electronic Signature(s) Signed: 07/20/2019 3:01:15 PM By: Carlene Coria RN Entered By: Carlene Coria on 06/04/2019 15:29:14

## 2019-07-20 NOTE — Progress Notes (Signed)
SHYHIEM, BEENEY ([id number]) Visit Report for 06/09/2019 Arrival Information Details Patient Name: Date of Service: Jordan Nelson, Jordan Nelson 06/09/2019 9:45 AM Medical Record [id number] Patient Account Number: [id number] Date of Birth/Sex: Treating RN: 26-Feb-1951 (68 y.o. M) Primary Care Leverne Tessler: CLINIC, [place] Other Clinician: Referring Brave Dack: Treating Wade Sigala/Extender:Stone III, [facility], [place] Weeks in Treatment: 0 Visit Information History Since Last Visit Added or deleted any medications: No Patient Arrived: Ambulatory Any new allergies or adverse reactions: No Arrival Time: 09:48 Had a fall or experienced change in No Accompanied By: self activities of daily living that may affect Transfer Assistance: None risk of falls: Patient Identification Verified: Yes Signs or symptoms of abuse/neglect since last No Secondary Verification Process Yes visito Completed: Hospitalized since last visit: No Patient Requires Transmission-Based No Implantable device outside of the clinic excluding No Precautions: cellular tissue based products placed in the center Patient Has Alerts: No since last visit: Has Dressing in Place as Prescribed: Yes Pain Present Now: No Electronic Signature(s) Signed: 07/20/2019 3:02:15 PM By: Sandre Kitty Entered By: Sandre Kitty on 06/09/2019 09:48:32 -------------------------------------------------------------------------------- Compression Therapy Details Patient Name: Date of Service: Jordan Nelson 06/09/2019 9:45 AM Medical Record [id number] Patient Account Number: [id number] Date of Birth/Sex: Treating RN: 10-Apr-1951 (68 y.o. Ernestene Mention Primary Care Nishant Schrecengost: CLINIC, [place] Other Clinician: Referring Aundrey Elahi: Treating Lenya Sterne/Extender:Stone III, [facility], [place] Weeks in Treatment: 0 Compression Therapy Performed for Wound Wound #1 Right,Anterior Lower Leg Assessment: Performed  By: Clinician Baruch Gouty, RN Compression Type: Three Hydrologist) Signed: 06/09/2019 1:37:03 PM By: Baruch Gouty RN, BSN Entered By: Baruch Gouty on 06/09/2019 10:21:25 -------------------------------------------------------------------------------- Encounter Discharge Information Details Patient Name: Date of Service: Jordan Nelson 06/09/2019 9:45 AM Medical Record [id number] Patient Account Number: [id number] Date of Birth/Sex: Treating RN: [birth year] (69 y.o. Ernestene Mention Primary Care Naftuli Dalsanto: CLINIC, [place] Other Clinician: Referring Kayal Mula: Treating Breccan Galant/Extender:Stone III, [facility], [place] Weeks in Treatment: 0 Encounter Discharge Information Items Discharge Condition: Stable Ambulatory Status: Ambulatory Discharge Destination: Home Transportation: Private Auto Accompanied By: self Schedule Follow-up Appointment: Yes Clinical Summary of Care: Patient Declined Electronic Signature(s) Signed: 06/09/2019 1:37:03 PM By: Baruch Gouty RN, BSN Entered By: Baruch Gouty on 06/09/2019 10:23:58 -------------------------------------------------------------------------------- Patient/Caregiver Education Details Patient Name: Jordan Nelson 10/28/2020andnbsp9:45 Date of Service: AM Medical Record [id number] Number: Patient Account Number: [id number] Treating RN: Date of Birth/Gender: [birth year] (68 y.o. Ernestene Mention) Other Clinician: Primary Care Physician: CLINIC, Treating Hinton Rao Physician/Extender: CLINIC, Weeks in Treatment: 0 Referring Physician: [place] Education Assessment Education Provided To: Patient Education Topics Provided Venous: Methods: Explain/Verbal Responses: Reinforcements needed, State content correctly Wound/Skin Impairment: Methods: Explain/Verbal Responses: Reinforcements needed, State content correctly [employer]) Signed:  06/09/2019 1:37:03 PM By: Baruch Gouty RN, BSN Entered By: Baruch Gouty on 06/09/2019 10:23:46 -------------------------------------------------------------------------------- Wound Assessment Details Patient Name: Date of Service: Jordan Nelson 06/09/2019 9:45 AM Medical Record [id number] Patient Account Number: [id number] Date of Birth/Sex: Treating RN: 10-09-1950 (67 y.o. Ernestene Mention Primary Care Teka Chanda: CLINIC, [place] Other Clinician: Referring Nikie Cid: Treating Terriana Barreras/Extender:Stone III, [facility], [place] Weeks in Treatment: 0 Wound Status Wound Number: 1 Primary Etiology: Venous Leg Ulcer Wound Location: Right Lower Leg - Anterior Wound Status: Open Wounding Event: Trauma Comorbid History: Anemia Date Acquired: 02/10/2019 Weeks Of Treatment: 0 Clustered Wound: No Wound Measurements Length: (cm) 7 % Reduct Width: (cm) 1.5 % Reduct Depth: (cm) 0.1 Epitheli Area: (cm) 8.247 Tunneli Volume: (cm) 0.825 Undermi Wound Description Classification: Full Thickness Without Exposed Support Foul Od Structures Slough/  Wound Flat and Intact Margin: Exudate Medium Amount: Exudate Serosanguineous Type: Exudate red, brown Color: Wound Bed Granulation Amount: Large (67-100%) Granulation Quality: Red Fascia Necrotic Amount: Small (1-33%) Fat Lay Necrotic Quality: Adherent Slough Tendon Expo Muscle Expo Joint Expos Bone Expose or After Cleansing: No Fibrino Yes Exposed Structure Exposed: No er (Subcutaneous Tissue) Exposed: Yes sed: No sed: No ed: No d: No ion in Area: 0% ion in Volume: 0% alization: None ng: No ning: No Electronic Signature(s) Signed: 06/09/2019 1:37:03 PM By: Zenaida Deed RN, BSN Entered By: Zenaida Deed on 06/09/2019 10:21:11 -------------------------------------------------------------------------------- Vitals Details Patient Name: Date of Service: Jordan Nelson 06/09/2019 9:45  AM Medical Record [id number] Patient Account Number: [id number] Date of Birth/Sex: Treating RN: Feb 26, 1951 (68 y.o. M) Primary Care Kamren Heintzelman: CLINIC, [place] Other Clinician: Referring Isatou Agredano: Treating Zakhai Meisinger/Extender:Stone III, [facility], [place] Weeks in Treatment: 0 Vital Signs Time Taken: 09:48 Temperature (F): 98.6 Height (in): 71 Pulse (bpm): 83 Weight (lbs): 180 Respiratory Rate (breaths/min): 18 Body Mass Index (BMI): 25.1 Blood Pressure (mmHg): 151/77 Reference Range: 80 - 120 mg / dl Electronic Signature(s) Signed: 07/20/2019 3:02:15 PM By: Karl Ito Entered By: Karl Ito on 06/09/2019 09:48:45

## 2019-07-20 NOTE — Progress Notes (Signed)
Jordan Nelson, Jordan L. ([id number]) Visit Report for 06/18/2019 Arrival Information Details Patient Name: Date of Service: Jordan Nelson, Jordan L. 06/18/2019 12:30 PM Medical Record [id number] Patient Account Number: [id number] Date of Birth/Sex: Treating RN: 09-02-1950 (68 y.o. Damaris SchoonerM) Boehlein, Linda Primary Care Jakeel Starliper: CLINIC, [place] Other Clinician: Referring Autumm Hattery: Treating Brookelin Felber/Extender:Robson, Victorino DecemberMichael CL[facility], [place] Weeks in Treatment: 2 Visit Information History Since Last Visit Added or deleted any medications: No Patient Arrived: Ambulatory Any new allergies or adverse reactions: No Arrival Time: 13:01 Had a fall or experienced change in No Accompanied By: self activities of daily living that may affect Transfer Assistance: None risk of falls: Patient Identification Verified: Yes Signs or symptoms of abuse/neglect since last No Secondary Verification Process Completed: Yes visito Patient Requires Transmission-Based No Hospitalized since last visit: No Precautions: Implantable device outside of the clinic excluding No Patient Has Alerts: No cellular tissue based products placed in the center since last visit: Has Dressing in Place as Prescribed: Yes Pain Present Now: No Electronic Signature(s) Signed: 07/20/2019 3:00:22 PM By: Karl Itoawkins, Destiny Entered By: Karl Itoawkins, Destiny on 06/18/2019 13:04:24 -------------------------------------------------------------------------------- Compression Therapy Details Patient Name: Date of Service: Jordan Nelson, Jordan L. 06/18/2019 12:30 PM Medical Record [id number] Patient Account Number: [id number] Date of Birth/Sex: Treating RN: 09-02-1950 (68 y.o. Katherina RightM) Dwiggins, Shannon Primary Care Skylar Priest: CLINIC, [place] Other Clinician: Referring Juwan Vences: Treating Karianne Nogueira/Extender:Robson, Victorino DecemberMichael CL[facility], [place] Weeks in Treatment: 2 Compression Therapy Performed for Wound Wound #1 Right,Anterior Lower  Leg Assessment: Performed By: Clinician Shawn Stalleaton, Bobbi, RN Compression Type: Three Layer Post Procedure Diagnosis Same as Pre-procedure Electronic Signature(s) Signed: 06/18/2019 6:04:56 PM By: Cherylin Mylarwiggins, Shannon Entered By: Cherylin Mylarwiggins, Shannon on 06/18/2019 13:38:12 -------------------------------------------------------------------------------- Encounter Discharge Information Details Patient Name: Date of Service: Jordan Nelson, Jordan L. 06/18/2019 12:30 PM Medical Record [id number] Patient Account Number: [id number] Date of Birth/Sex: Treating RN: 09-02-1950 (68 y.o. Tammy SoursM) Deaton, Bobbi Primary Care Josepha Barbier: CLINIC, [place] Other Clinician: Referring Georgiann Neider: Treating Sarahbeth Cashin/Extender:Robson, Victorino DecemberMichael CL[facility], [place] Weeks in Treatment: 2 Encounter Discharge Information Items Post Procedure Vitals Discharge Condition: Stable Temperature (F): 98.2 Ambulatory Status: Ambulatory Pulse (bpm): 66 Discharge Destination: Home Respiratory Rate (breaths/min): 18 Transportation: Private Auto Blood Pressure (mmHg): 118/78 Accompanied By: self Schedule Follow-up Appointment: Yes Clinical Summary of Care: Electronic Signature(s) Signed: 06/18/2019 5:26:58 PM By: Shawn Stalleaton, Bobbi Entered By: Shawn Stalleaton, Bobbi on 06/18/2019 15:48:46 -------------------------------------------------------------------------------- Lower Extremity Assessment Details Patient Name: Date of Service: Jordan Nelson, Jordan L. 06/18/2019 12:30 PM Medical Record [id number] Patient Account Number: [id number] Date of Birth/Sex: Treating RN: 09-02-1950 (68 y.o. Damaris SchoonerM) Boehlein, Linda Primary Care Mumtaz Lovins: CLINIC, [place] Other Clinician: Referring Dionis Autry: Treating Amaia Lavallie/Extender:Robson, Victorino DecemberMichael CLIN[facility], [place] Weeks in Treatment: 2 Edema Assessment Assessed: [Left: No] [Right: No] Edema: [Left: Ye] [Right: s] Calf Left: Right: Point of Measurement: 47 cm From Medial Instep cm 38.5 cm Ankle Left:  Right: Point of Measurement: 11 cm From Medial Instep cm 23 cm Vascular Assessment Pulses: Dorsalis Pedis Palpable: [Right:Yes] Electronic Signature(s) Signed: 06/18/2019 6:12:06 PM By: Zenaida DeedBoehlein, Linda RN, BSN Entered By: Zenaida DeedBoehlein, Linda on 06/18/2019 13:27:25 -------------------------------------------------------------------------------- Multi Wound Chart Details Patient Name: Date of Service: Jordan Nelson, Jordan L. 06/18/2019 12:30 PM Medical Record [id number] Patient Account Number: [id number] Date of Birth/Sex: Treating RN: 09-02-1950 (68 y.o. Damaris SchoonerM) Boehlein, Linda Primary Care Stefen Juba: CLIN[facility], [place] Other Clinician: Referring Chloe Baig: Treating Brenen Beigel/Extender:[facility] [place] Weeks in Treatment: 2 Vital Signs Height(in): 71 Pulse(bpm): 66 Weight(lbs): 180 Blood Pressure(mmHg): 118/78 Body Mass Index(BMI): 25 Temperature(F): 98.2 Respiratory 18 Rate(breaths/min): Photos: [1:No Photos] [N/A:N/A] Wound Location: [1:Right Lower Leg - Anterior] [N/A:N/A] Wounding Event: [1:Trauma] [N/A:N/A] Primary Etiology: [  1:Venous Leg Ulcer] [N/A:N/A] Comorbid History: [1:Anemia] [N/A:N/A] Date Acquired: [1:02/10/2019] [N/A:N/A] Weeks of Treatment: [1:2] [N/A:N/A] Wound Status: [1:Open] [N/A:N/A] Measurements L x W x D [1:4x1.4x0.1] [N/A:N/A] (cm) Area (cm) : [1:4.398] [N/A:N/A] Volume (cm) : [1:0.44] [N/A:N/A] % Reduction in Area: [1:46.70%] [N/A:N/A] % Reduction in Volume: [1:46.70%] [N/A:N/A] Classification: [1:Full Thickness Without Exposed Support Structures] [N/A:N/A] Exudate Amount: [1:Medium] [N/A:N/A] Exudate Type: [1:Serosanguineous] [N/A:N/A] Exudate Color: [1:red, brown] [N/A:N/A] Wound Margin: [1:Thickened] [N/A:N/A] Granulation Amount: [1:Large (67-100%)] [N/A:N/A] Granulation Quality: [1:Red] [N/A:N/A] Necrotic Amount: [1:None Present (0%)] [N/A:N/A] Exposed Structures: [1:Fat Layer (Subcutaneous Tissue) Exposed: Yes Fascia: No  Tendon: No Muscle: No Joint: No Bone: No Medium (34-66%)] [N/A:N/A N/A] Treatment Notes Electronic Signature(s) Signed: 06/18/2019 6:12:06 PM By: Zenaida Deed RN, BSN Signed: 06/20/2019 8:54:13 AM By: Baltazar Najjar MD Entered By: Baltazar Najjar on 06/18/2019 13:36:57 -------------------------------------------------------------------------------- Multi-Disciplinary Care Plan Details Patient Name: Date of Service: Jordan Adam 06/18/2019 12:30 PM Medical Record [id number] Patient Account Number: [id number] Date of Birth/Sex: Treating RN: [birth year] (68 y.o. [name] Primary Care Chesney Klimaszewski: CLINIC, [place] Other Clinician: Referring Zoei Amison: Treating Charlesia Canaday/Extender:Robson, Victorino December, [place] Weeks in Treatment: 2 Active Inactive Venous Leg Ulcer Nursing Diagnoses: Knowledge deficit related to disease process and management Potential for venous Insuffiency (use before diagnosis confirmed) Goals: Patient will maintain optimal edema control Date Initiated: 06/04/2019 Target Resolution Date: 07/02/2019 Goal Status: Active Patient/caregiver will verbalize understanding of disease process and disease management Date Initiated: 06/04/2019 Target Resolution Date: 07/02/2019 Goal Status: Active Interventions: Assess peripheral edema status every visit. Compression as ordered Provide education on venous insufficiency Notes: Wound/Skin Impairment Nursing Diagnoses: Impaired tissue integrity Knowledge deficit related to ulceration/compromised skin integrity Goals: Patient/caregiver will verbalize understanding of skin care regimen Date Initiated: 06/04/2019 Target Resolution Date: 07/02/2019 Goal Status: Active Ulcer/skin breakdown will have a volume reduction of 30% by week 4 Date Initiated: 06/04/2019 Target Resolution Date: 07/02/2019 Goal Status: Active Interventions: Assess patient/caregiver ability to obtain necessary  supplies Assess patient/caregiver ability to perform ulcer/skin care regimen upon admission and as needed Assess ulceration(s) every visit Provide education on ulcer and skin care Notes: Electronic Signature(s) Signed: 06/18/2019 6:04:56 PM By: Cherylin Mylar Entered By: Cherylin Mylar on 06/18/2019 13:26:33 -------------------------------------------------------------------------------- Pain Assessment Details Patient Name: Date of Service: TYHEEM, BOUGHNER 06/18/2019 12:30 PM Medical Record [id number] Patient Account Number: [id number] Date of Birth/Sex: Treating RN: 05-25-1951 (68 y.o. Damaris Schooner Primary Care Tamar Miano: CLINIC, [place] Other Clinician: Referring Samvel Zinn: Treating Jere Vanburen/Extender:Robson, Victorino December, [place] Weeks in Treatment: 2 Active Problems Location of Pain Severity and Description of Pain Patient Has Paino No Site Locations Pain Management and Medication Current Pain Management: Electronic Signature(s) Signed: 06/18/2019 6:12:06 PM By: Zenaida Deed RN, BSN Signed: 07/20/2019 3:00:22 PM By: Karl Ito Entered By: Karl Ito on 06/18/2019 13:04:31 -------------------------------------------------------------------------------- Patient/Caregiver Education Details Patient Name: Date of Service: Jordan Adam 11/6/2020andnbsp12:30 PM Medical Record [id number] Patient Account Number: [id number] Date of Birth/Gender: Treating RN: [birth year] (68 y.o. [name] Primary Care Physician: CLINIC, [place] Other Clinician: Referring Physician: Treating Physician/Extender:Robson, Victorino December, [place] Weeks in Treatment: 2 Education Assessment Education Provided To: Patient Education Topics Provided Venous: Methods: Explain/Verbal Responses: State content correctly Wound/Skin Impairment: Methods: Explain/Verbal Responses: State content correctly Electronic Signature(s) Signed:  06/18/2019 6:04:56 PM By: Cherylin Mylar Entered By: Cherylin Mylar on 06/18/2019 13:27:17 -------------------------------------------------------------------------------- Wound Assessment Details Patient Name: Date of Service: Jordan Adam 06/18/2019 12:30 PM Medical Record [id number] Patient Account Number: [id number] Date of Birth/Sex: Treating RN: 02-Mar-1951 (68 y.o. Damaris Schooner Primary  Care Zhion Pevehouse: CLINIC, [place] Other Clinician: Referring Deandra Gadson: Treating Ezekiah Massie/Extender:Robson, Linus Galas, [place] Weeks in Treatment: 2 Wound Status Wound Number: 1 Primary Etiology: Venous Leg Ulcer Wound Location: Right Lower Leg - Anterior Wound Status: Open Wounding Event: Trauma Comorbid History: Anemia Date Acquired: 02/10/2019 Weeks Of Treatment: 2 Clustered Wound: No Photos Wound Measurements Length: (cm) 4 % Reduct Width: (cm) 1.4 % Reduct Depth: (cm) 0.1 Epitheli Area: (cm) 4.398 Tunneli Volume: (cm) 0.44 Undermi Wound Description Classification: Full Thickness Without Exposed Support Foul Od Structures Slough/ Wound Thickened Margin: Exudate Medium Amount: Exudate Serosanguineous Type: Exudate red, brown Color: Wound Bed Granulation Amount: Large (67-100%) Granulation Quality: Red Fascia Necrotic Amount: None Present (0%) Fat Lay Tendon Muscle Joint Exp Bone Expo or After Cleansing: No Fibrino Yes Exposed Structure Exposed: No er (Subcutaneous Tissue) Exposed: Yes Exposed: No Exposed: No osed: No sed: No ion in Area: 46.7% ion in Volume: 46.7% alization: Medium (34-66%) ng: No ning: No Electronic Signature(s) Signed: 06/23/2019 4:27:20 PM By: Mikeal Hawthorne EMT/HBOT Signed: 06/23/2019 5:49:26 PM By: Baruch Gouty RN, BSN Previous Signature: 06/18/2019 6:12:06 PM Version By: Baruch Gouty RN, BSN Entered By: Mikeal Hawthorne on 06/23/2019  09:35:44 -------------------------------------------------------------------------------- [place] Details Patient Name: Date of Service: Siri Cole 06/18/2019 12:30 PM Medical Record [id number] Patient Account Number: [id number] Date of Birth/Sex: Treating RN: 08/05/1951 (68 y.o. Ernestene Mention Primary Care Dilan Novosad: CLINIC, [place] Other Clinician: Referring Samantha Ragen: Treating Lyall Faciane/Extender:Robson, Linus Galas, [place] Weeks in Treatment: 2 Vital Signs Time Taken: 13:02 Temperature (F): 98.2 Height (in): 71 Pulse (bpm): 66 Weight (lbs): 180 Respiratory Rate (breaths/min): 18 Body Mass Index (BMI): 25.1 Blood Pressure (mmHg): 118/78 Reference Range: 80 - 120 mg / dl Electronic Signature(s) Signed: 07/20/2019 3:00:22 PM By: Sandre Kitty Entered By: Sandre Kitty on 06/18/2019 13:04:16

## 2019-07-20 NOTE — Progress Notes (Signed)
Jordan Nelson, Jordan Nelson ([id number]) Visit Report for 06/25/2019 Arrival Information Details Patient Name: Date of Service: Jordan Nelson, Jordan Nelson 06/25/2019 12:30 PM Medical Record [id number] Patient Account Number: [id number] Date of Birth/Sex: Treating RN: 1951/04/15 (68 y.o. Judie Petit) Yevonne Pax Primary Care Mclean Moya: CLINIC, [place] Other Clinician: Referring Vivianna Piccini: Treating Angelicia Lessner/Extender:Robson, Victorino December, [place] Weeks in Treatment: 3 Visit Information History Since Last Visit All ordered tests and consults were completed: No Patient Arrived: Ambulatory Added or deleted any medications: No Arrival Time: 12:46 Any new allergies or adverse reactions: No Accompanied By: self Had a fall or experienced change in No Transfer Assistance: None activities of daily living that may affect Patient Identification Verified: Yes risk of falls: Secondary Verification Process Completed: Yes Signs or symptoms of abuse/neglect since last No Patient Requires Transmission-Based No visito Precautions: Hospitalized since last visit: No Patient Has Alerts: No Implantable device outside of the clinic excluding No cellular tissue based products placed in the center since last visit: Has Dressing in Place as Prescribed: Yes Pain Present Now: No Electronic Signature(s) Signed: 07/20/2019 2:49:42 PM By: Yevonne Pax RN Entered By: Yevonne Pax on 06/25/2019 12:47:12 -------------------------------------------------------------------------------- Compression Therapy Details Patient Name: Date of Service: Jordan Nelson, Jordan Nelson 06/25/2019 12:30 PM Medical Record [id number] Patient Account Number: [id number] Date of Birth/Sex: Treating RN: 05-Sep-1950 (68 y.o. [name] Primary Care Zavannah Deblois: CLINIC, [place] Other Clinician: Referring Jens Siems: Treating Ermal Brzozowski/Extender:Robson, Victorino December, [place] Weeks in Treatment: 3 Compression Therapy Performed for  Wound Wound #1 Right,Anterior Lower Leg Assessment: Performed By: Clinician Shawn Stall, RN Compression Type: Three Layer Post Procedure Diagnosis Same as Pre-procedure Electronic Signature(s) Signed: 06/25/2019 5:20:59 PM By: Cherylin Mylar Entered By: Cherylin Mylar on 06/25/2019 13:11:27 -------------------------------------------------------------------------------- Encounter Discharge Information Details Patient Name: Date of Service: Jordan Adam 06/25/2019 12:30 PM Medical Record [id number] Patient Account Number: [id number] Date of Birth/Sex: Treating RN: [birth year] (68 y.o. Tammy Sours Primary Care Elga Santy: CLINIC, [place] Other Clinician: Referring Abdoul Encinas: Treating Devota Viruet/Extender:Robson, Victorino December, [place] Weeks in Treatment: 3 Encounter Discharge Information Items Post Procedure Vitals Discharge Condition: Stable Temperature (F): 98.5 Ambulatory Status: Ambulatory Pulse (bpm): 75 Discharge Destination: Home Respiratory Rate (breaths/min): 18 Transportation: Private Auto Blood Pressure (mmHg): 147/88 Accompanied By: self Schedule Follow-up Appointment: Yes Clinical Summary of Care: Electronic Signature(s) Signed: 06/25/2019 5:02:36 PM By: Shawn Stall Entered By: Shawn Stall on 06/25/2019 13:24:36 -------------------------------------------------------------------------------- Lower Extremity Assessment Details Patient Name: Date of Service: Jordan Nelson, Jordan Nelson 06/25/2019 12:30 PM Medical Record [id number] Patient Account Number: [id number] Date of Birth/Sex: Treating RN: [birth year] (68 y.o. Judie Petit) Yevonne Pax Primary Care Leonel Mccollum: CLINIC, [place] Other Clinician: Referring Trapper Meech: Treating Rickell Wiehe/Extender:Robson, Victorino December, [place] Weeks in Treatment: 3 Edema Assessment Assessed: [Left: No] [Right: No] Edema: [Left: Ye] [Right: s] Calf Left: Right: Point of Measurement: 47 cm From Medial  Instep cm 38.5 cm Ankle Left: Right: Point of Measurement: 11 cm From Medial Instep cm 23 cm Electronic Signature(s) Signed: 07/20/2019 2:49:42 PM By: Yevonne Pax RN Entered By: Yevonne Pax on 06/25/2019 12:56:13 -------------------------------------------------------------------------------- Multi Wound Chart Details Patient Name: Date of Service: Jordan Adam 06/25/2019 12:30 PM Medical Record [id number] Patient Account Number: [id number] Date of Birth/Sex: Treating RN: 1950-12-23 (68 y.o. [name] Primary Care Janis Cuffe: CLINIC, [place] Other Clinician: Referring Jeannie Mallinger: Treating Linus Weckerly/Extender:Robson, Victorino December, [place] Weeks in Treatment: 3 Vital Signs Height(in): 71 Pulse(bpm): 75 Weight(lbs): 180 Blood Pressure(mmHg): 147/88 Body Mass Index(BMI): 25 Temperature(F): 98.5 Respiratory 18 Rate(breaths/min): Photos: [1:No Photos] [N/A:N/A] Wound Location: [1:Right Lower Leg - Anterior] [N/A:N/A] Wounding Event: [1:Trauma] [N/A:N/A] Primary  Etiology: [1:Venous Leg Ulcer] [N/A:N/A] Comorbid History: [1:Anemia] [N/A:N/A] Date Acquired: [1:02/10/2019] [N/A:N/A] Weeks of Treatment: [1:3] [N/A:N/A] Wound Status: [1:Open] [N/A:N/A] Measurements L x W x D [1:3.3x0.6x0.1] [N/A:N/A] (cm) Area (cm) : [1:1.555] [N/A:N/A] Volume (cm) : [1:0.156] [N/A:N/A] % Reduction in Area: [1:81.10%] [N/A:N/A] % Reduction in Volume: [1:81.10%] [N/A:N/A] Classification: [1:Full Thickness Without Exposed Support Structures] [N/A:N/A] Exudate Amount: [1:Small] [N/A:N/A] Exudate Type: [1:Serosanguineous] [N/A:N/A] Exudate Color: [1:red, brown] [N/A:N/A] Wound Margin: [1:Thickened] [N/A:N/A] Granulation Amount: [1:Large (67-100%)] [N/A:N/A] Granulation Quality: [1:Pink] [N/A:N/A] Necrotic Amount: [1:Small (1-33%)] [N/A:N/A] Exposed Structures: [1:Fat Layer (Subcutaneous Tissue) Exposed: Yes Fascia: No Tendon: No Muscle: No Joint: No Bone: No]  [N/A:N/A] Epithelialization: [1:Large (67-100%)] [N/A:N/A] Debridement: [1:Debridement - Selective/Open Wound] [N/A:N/A] Pre-procedure [1:13:05] [N/A:N/A] Verification/Time Out Taken: Pain Control: [1:Other] [N/A:N/A] Tissue Debrided: [1:Necrotic/Eschar] [N/A:N/A] Level: [1:Non-Viable Tissue] [N/A:N/A] Debridement Area (sq cm):1.98 [N/A:N/A] Instrument: [1:Curette] [N/A:N/A] Bleeding: [1:None] [N/A:N/A] Procedural Pain: [1:0] [N/A:N/A] Post Procedural Pain: [1:0] [N/A:N/A] Debridement Treatment Procedure was tolerated [N/A:N/A] Response: [1:well] Post Debridement [1:3.3x0.6x0.1] [N/A:N/A] Measurements L x W x D (cm) Post Debridement [1:0.156] [N/A:N/A] Volume: (cm) Procedures Performed: Debridement [N/A:N/A] Treatment Notes Electronic Signature(s) Signed: 06/25/2019 5:20:59 PM By: Kela Millin Signed: 06/25/2019 5:33:43 PM By: Linton Ham MD Entered By: Linton Ham on 06/25/2019 13:10:34 -------------------------------------------------------------------------------- Multi-Disciplinary Care Plan Details Patient Name: Date of Service: Jordan Nelson 06/25/2019 12:30 PM Medical Record [id number] Patient Account Number: [id number] Date of Birth/Sex: Treating RN: 29-Sep-1950 (68 y.o. Marvis Repress Primary Care Jodel Mayhall: CLINIC, [place] Other Clinician: Referring Jaisen Wiltrout: Treating Alexea Blase/Extender:Robson, Linus Galas, [place] Weeks in Treatment: 3 Active Inactive Venous Leg Ulcer Nursing Diagnoses: Knowledge deficit related to disease process and management Potential for venous Insuffiency (use before diagnosis confirmed) Goals: Patient will maintain optimal edema control Date Initiated: 06/04/2019 Target Resolution Date: 07/02/2019 Goal Status: Active Patient/caregiver will verbalize understanding of disease process and disease management Date Initiated: 06/04/2019 Target Resolution Date: 07/02/2019 Goal Status:  Active Interventions: Assess peripheral edema status every visit. Compression as ordered Provide education on venous insufficiency Notes: Wound/Skin Impairment Nursing Diagnoses: Impaired tissue integrity Knowledge deficit related to ulceration/compromised skin integrity Goals: Patient/caregiver will verbalize understanding of skin care regimen Date Initiated: 06/04/2019 Target Resolution Date: 07/02/2019 Goal Status: Active Ulcer/skin breakdown will have a volume reduction of 30% by week 4 Date Initiated: 06/04/2019 Target Resolution Date: 07/02/2019 Goal Status: Active Interventions: Assess patient/caregiver ability to obtain necessary supplies Assess patient/caregiver ability to perform ulcer/skin care regimen upon admission and as needed Assess ulceration(s) every visit Provide education on ulcer and skin care Notes: Electronic Signature(s) Signed: 06/25/2019 5:20:59 PM By: Kela Millin Entered By: Kela Millin on 06/25/2019 13:03:22 -------------------------------------------------------------------------------- Pain Assessment Details Patient Name: Date of Service: Jordan Nelson 06/25/2019 12:30 PM Medical Record [id number] Patient Account Number: [id number] Date of Birth/Sex: Treating RN: [birth year] (68 y.o. Jerilynn Mages) Carlene Coria Primary Care Zaccheus Edmister: CLINIC, [place] Other Clinician: Referring Anaysia Germer: Treating Donta Mcinroy/Extender:Robson, Linus Galas, [place] Weeks in Treatment: 3 Active Problems Location of Pain Severity and Description of Pain Patient Has Paino No Site Locations Pain Management and Medication Current Pain Management: Electronic Signature(s) Signed: 07/20/2019 2:49:42 PM By: Carlene Coria RN Entered By: Carlene Coria on 06/25/2019 12:47:43 -------------------------------------------------------------------------------- Patient/Caregiver Education Details Patient Name: Jordan Nelson 11/13/2020andnbsp12:30 Date of  Service: PM Medical Record [id number] Number: Patient Account Number: [id number] Date of Birth/Gender: 1950/12/13 (68 y.o. M) Treating RN: Kela Millin Other Clinician: Primary Care Physician: CLINIC, [place] Treating Linton Ham Physician/Extender: CLINIC, Referring Physician: Cain Sieve in Treatment: 3 Education Assessment Education Provided To: Patient Education Topics  Provided Venous: Methods: Explain/Verbal Responses: State content correctly Wound/Skin Impairment: Methods: Explain/Verbal Responses: State content correctly Electronic Signature(s) Signed: 06/25/2019 5:20:59 PM By: Cherylin Mylarwiggins, Shannon Entered By: Cherylin Mylarwiggins, Shannon on 06/25/2019 13:11:07 -------------------------------------------------------------------------------- Wound Assessment Details Patient Name: Date of Service: Jordan Nelson, Jordan L. 06/25/2019 12:30 PM Medical Record [id number] Patient Account Number: [id number] Date of Birth/Sex: Treating RN: 04/18/1951 (68 y.o. Katherina RightM) Dwiggins, Shannon Primary Care Fizza Scales: CLINIC, [place] Other Clinician: Referring Benjamine Strout: Treating Richey Doolittle/Extender:Robson, Victorino DecemberMichael CLINIC, [place] Weeks in Treatment: 3 Wound Status Wound Number: 1 Primary Etiology: Venous Leg Ulcer Wound Location: Right Lower Leg - Anterior Wound Status: Open Wounding Event: Trauma Comorbid History: Anemia Date Acquired: 02/10/2019 Weeks Of Treatment: 3 Clustered Wound: No Photos Wound Measurements Length: (cm) 3.3 % Reduc Width: (cm) 0.6 % Reduc Depth: (cm) 0.1 Epithel Area: (cm) 1.555 Tunnel Volume: (cm) 0.156 Underm Wound Description Full Thickness Without Exposed Support Foul O Classification: Structures Slough Wound Thickened Margin: Exudate Small Amount: Exudate Serosanguineous Type: Exudate red, brown red, brown Color: Wound Bed Granulation Amount: Large (67-100%) Granulation Quality: Pink Fascia Exp Necrotic Amount: Small  (1-33%) Fat Layer Necrotic Quality: Adherent Slough Tendon Exp Muscle Exp Joint Expo Bone Expos dor After Cleansing: No /Fibrino No Exposed Structure osed: No (Subcutaneous Tissue) Exposed: Yes osed: No osed: No sed: No ed: No tion in Area: 81.1% tion in Volume: 81.1% ialization: Large (67-100%) ing: No ining: No Electronic Signature(s) Signed: 06/29/2019 4:00:22 PM By: Benjaman KindlerJones, Dedrick EMT/HBOT Signed: 07/01/2019 6:05:31 PM By: Cherylin Mylarwiggins, Shannon Previous Signature: 06/25/2019 5:20:59 PM Version By: Cherylin Mylarwiggins, Shannon Entered By: Benjaman KindlerJones, Dedrick on 06/29/2019 09:30:12 -------------------------------------------------------------------------------- Vitals Details Patient Name: Date of Service: Jordan Nelson, Montrell L. 06/25/2019 12:30 PM Medical Record [id number] Patient Account Number: [id number] Date of Birth/Sex: Treating RN: 04/18/1951 (68 y.o. Judie PetitM) Yevonne PaxEpps, Carrie Primary Care Luciana Cammarata: CLINIC, [place] Other Clinician: Referring Darnesha Diloreto: Treating Taline Nass/Extender:Robson, Victorino DecemberMichael CLINIC, [place] Weeks in Treatment: 3 Vital Signs Time Taken: 12:47 Temperature (F): 98.5 Height (in): 71 Pulse (bpm): 75 Weight (lbs): 180 Respiratory Rate (breaths/min): 18 Body Mass Index (BMI): 25.1 Blood Pressure (mmHg): 147/88 Reference Range: 80 - 120 mg / dl Electronic Signature(s) Signed: 07/20/2019 2:49:42 PM By: Yevonne PaxEpps, Carrie RN Entered By: Yevonne PaxEpps, Carrie on 06/25/2019 12:47:36

## 2019-07-20 NOTE — Progress Notes (Signed)
Jordan Nelson, Jordan Nelson ([id number]) Visit Report for 06/04/2019 Abuse/Suicide Risk Screen Details Patient Name: Date of Service: Jordan Nelson, Jordan Nelson 06/04/2019 2:45 PM Medical Record [id number] Patient Account Number: [id number] Date of Birth/Sex: Treating RN: 1951-01-02 (68 y.o. Judie Petit) Yevonne Pax Primary Care Savyon Loken: CLINIC, [place] Other Clinician: Referring Vinetta Brach: Treating Heru Montz/Extender:Robson, Victorino December, [place] Weeks in Treatment: 0 Abuse/Suicide Risk Screen Items Answer ABUSE RISK SCREEN: Has anyone close to you tried to hurt or harm you recentlyo No Do you feel uncomfortable with anyone in your familyo No Has anyone forced you do things that you didnt want to doo No Electronic Signature(s) Signed: 07/20/2019 3:01:15 PM By: Yevonne Pax RN Entered By: Yevonne Pax on 06/04/2019 15:33:36 -------------------------------------------------------------------------------- Activities of Daily Living Details Patient Name: Date of Service: Jordan Nelson, Jordan Nelson 06/04/2019 2:45 PM Medical Record [id number] Patient Account Number: [id number] Date of Birth/Sex: Treating RN: Sep 27, 1950 (68 y.o. Judie Petit) Yevonne Pax Primary Care Dixie Jafri: CLINIC, [place] Other Clinician: Referring Gloris Shiroma: Treating Kamarie Veno/Extender:Robson, Victorino December, [place] Weeks in Treatment: 0 Activities of Daily Living Items Answer Activities of Daily Living (Please select one for each item) Drive Automobile Completely Able Take Medications Completely Able Use Telephone Completely Able Care for Appearance Completely Able Use Toilet Completely Able Bath / Shower Completely Able Dress Self Completely Able Feed Self Completely Able Walk Completely Able Get In / Out Bed Completely Able Housework Completely Able Prepare Meals Completely Able Handle Money Completely Able Shop for Self Completely Able Electronic Signature(s) Signed: 07/20/2019 3:01:15 PM By: Yevonne Pax  RN Entered By: Yevonne Pax on 06/04/2019 15:34:10 -------------------------------------------------------------------------------- Education Screening Details Patient Name: Date of Service: Jordan Nelson 06/04/2019 2:45 PM Medical Record [id number] Patient Account Number: [id number] Date of Birth/Sex: Treating RN: 12-May-1951 (68 y.o. Judie Petit) Yevonne Pax Primary Care Momoko Slezak: CLINIC, [place] Other Clinician: Referring Damaris Geers: Treating Novice Vrba/Extender:Robson, Victorino December, [place] Weeks in Treatment: 0 Primary Learner Assessed: Patient Learning Preferences/Education Level/Primary Language Learning Preference: Explanation Highest Education Level: College or Above Preferred Language: Economist Language Barrier: No Translator Needed: No Memory Deficit: No Emotional Barrier: No Cultural/Religious Beliefs Affecting Medical Care: No Physical Barrier Impaired Vision: No Impaired Hearing: No Decreased Hand dexterity: No Knowledge/Comprehension Knowledge Level: High Comprehension Level: High Ability to understand written High instructions: Ability to understand verbal High instructions: Motivation Anxiety Level: Calm Cooperation: Cooperative Education Importance: Acknowledges Need Interest in Health Problems: Asks Questions Perception: Coherent Willingness to Engage in Self- High Management Activities: Readiness to Engage in Self- High Management Activities: Electronic Signature(s) Signed: 07/20/2019 3:01:15 PM By: Yevonne Pax RN Entered By: Yevonne Pax on 06/04/2019 15:34:46 -------------------------------------------------------------------------------- Fall Risk Assessment Details Patient Name: Date of Service: Jordan Nelson 06/04/2019 2:45 PM Medical Record [id number] Patient Account Number: [id number] Date of Birth/Sex: Treating RN: 03/08/1951 (68 y.o. Judie Petit) Yevonne Pax Primary Care Daylon Lafavor: CLINIC,  [place] Other Clinician: Referring Plummer Matich: Treating Rutherford Alarie/Extender:Robson, Victorino December, [place] Weeks in Treatment: 0 Fall Risk Assessment Items Have you had 2 or more falls in the last 12 monthso 0 No Have you had any fall that resulted in injury in the last 12 monthso 0 No FALLS RISK SCREEN History of falling - immediate or within 3 months 0 No Secondary diagnosis (Do you have 2 or more medical diagnoseso) 0 No Ambulatory aid None/bed rest/wheelchair/nurse 0 No Crutches/cane/walker 0 No Furniture 0 No Intravenous therapy Access/Saline/Heparin Lock 0 No Weak (short steps with or without shuffle, stooped but able to lift head 0 No while walking, may seek support from furniture) Impaired (short steps with shuffle, may have  difficulty arising from chair, 0 No head down, impaired balance) Mental Status Oriented to own ability 0 No Overestimates or forgets limitations 0 No Risk Level: Low Risk Score: 0 Electronic Signature(s) Signed: 07/20/2019 3:01:15 PM By: Carlene Coria RN Entered By: Carlene Coria on 06/04/2019 15:34:54 -------------------------------------------------------------------------------- Foot Assessment Details Patient Name: Date of Service: Jordan Nelson 06/04/2019 2:45 PM Medical Record [id number] Patient Account Number: [id number] Date of Birth/Sex: Treating RN: 1950-12-26 (68 y.o. Jerilynn Mages) Carlene Coria Primary Care Arianne Klinge: CLINIC, [place] Other Clinician: Referring Mannie Ohlin: Treating Quinterius Gaida/Extender:Robson, Linus Galas, [place] Weeks in Treatment: 0 Foot Assessment Items Site Locations + = Sensation present, - = Sensation absent, C = Callus, U = Ulcer R = Redness, W = Warmth, M = Maceration, PU = Pre-ulcerative lesion F = Fissure, S = Swelling, D = Dryness Assessment Right: Left: Other Deformity: No No Prior Foot Ulcer: No No Prior Amputation: No No Charcot Joint: No No Ambulatory Status: Gait: Electronic  Signature(s) Signed: 07/20/2019 3:01:15 PM By: Carlene Coria RN Entered By: Carlene Coria on 06/04/2019 15:37:36 -------------------------------------------------------------------------------- Nutrition Risk Screening Details Patient Name: Date of Service: Jordan Nelson, Jordan Nelson 06/04/2019 2:45 PM Medical Record [id number] Patient Account Number: [id number] Date of Birth/Sex: Treating RN: 01/30/1951 (68 y.o. Jerilynn Mages) Carlene Coria Primary Care Marcee Jacobs: CLINIC, [place] Other Clinician: Referring Rotunda Worden: Treating Riyan Gavina/Extender:Robson, Linus Galas, [place] Weeks in Treatment: 0 Height (in): 71 Weight (lbs): 180 Body Mass Index (BMI): 25.1 Nutrition Risk Screening Items Score Screening NUTRITION RISK SCREEN: I have an illness or condition that made me change the kind and/or 0 No amount of food I eat I eat fewer than two meals per day 0 No I eat few fruits and vegetables, or milk products 0 No I have three or more drinks of beer, liquor or wine almost every day 0 No I have tooth or mouth problems that make it hard for me to eat 0 No I don't always have enough money to buy the food I need 0 No I eat alone most of the time 0 No I take three or more different prescribed or over-the-counter drugs a day 0 No 0 No Without wanting to, I have lost or gained 10 pounds in the last six months I am not always physically able to shop, cook and/or feed myself 0 No Nutrition Protocols Good Risk Protocol 0 No interventions needed Moderate Risk Protocol High Risk Proctocol Risk Level: Good Risk Score: 0 Electronic Signature(s) Signed: 07/20/2019 3:01:15 PM By: Carlene Coria RN Entered By: Carlene Coria on 06/04/2019 15:35:14

## 2019-08-24 ENCOUNTER — Encounter (HOSPITAL_COMMUNITY): Payer: Self-pay

## 2019-08-24 ENCOUNTER — Ambulatory Visit (HOSPITAL_COMMUNITY)
Admission: EM | Admit: 2019-08-24 | Discharge: 2019-08-24 | Disposition: A | Discharge status: Home / Self Care | Payer: Medicare HMO | Attending: Physician Assistant | Admitting: Physician Assistant

## 2019-08-24 ENCOUNTER — Other Ambulatory Visit: Payer: Self-pay

## 2019-08-24 DIAGNOSIS — B351 Tinea unguium: Secondary | ICD-10-CM

## 2019-08-24 MED ORDER — TERBINAFINE HCL 250 MG PO TABS: 250.0000 mg | ORAL_TABLET | Freq: Every day | ORAL | 0 refills | Status: AC

## 2019-08-24 NOTE — ED Triage Notes (Signed)
Pt states he has a nail issue with his right foot big toe. Pt states the toe nail is brittle. Pt states its causing him pain at night. This has been going on for 7 days.

## 2019-08-24 NOTE — Discharge Instructions (Addendum)
Take this medication for 12 weeks, 1 time a day.

## 2019-08-24 NOTE — ED Provider Notes (Addendum)
[place]    CSN: [id number] Arrival date & time: 08/24/19  1909      History   Chief Complaint Chief Complaint  Patient presents with  . Nail Problem    HPI Jordan Nelson is a 69 y.o. male.   Patient reports to urgent care today for 7 days of right toe pain. He states the toe nail is the main cause of pain. It has been brittle for some time. He believes it has fungus in it. He reports history of fungal infections in his feet. He reports pain at night and with pressure on the nail. He denies fever, chills or joint pain in his toe.   He has not tried medications for it.      Past Medical History:  Diagnosis Date  . Arthritis   . Chronic back pain   . Epigastric hernia   . GERD (gastroesophageal reflux disease)   . Numbness    right hand  . Pollen allergy   . Scoliosis    " minor"  . Tuberculosis    early 1980's  . Wears glasses     There are no problems to display for this patient.   Past Surgical History:  Procedure Laterality Date  . EYE SURGERY    . HERNIA REPAIR         Home Medications    Prior to Admission medications   Medication Sig Start Date End Date Taking? Authorizing Provider  cholecalciferol (VITAMIN D3) 25 MCG (1000 UT) tablet Take 1,000 Units by mouth daily.    [provider]  clotrimazole (LOTRIMIN) 1 % cream Apply to affected area 2 times daily 12/24/18   Vanessa Kick, MD  clotrimazole-betamethasone (LOTRISONE) cream Apply to affected area 2 times daily prn 02/28/19   Raylene Everts, MD  terbinafine (LAMISIL) 250 MG tablet Take 1 tablet (250 mg total) by mouth daily. 08/24/19 11/16/19  Taequan Stockhausen, Marguerita Beards, [name]    Family History Family History  Problem Relation Age of Onset  . Hypertension Mother     Social History Social History   Tobacco Use  . Smoking status: Former Smoker    Types: Cigarettes  . Smokeless tobacco: Never Used  Substance Use Topics  . Alcohol use: No  . Drug use: No     Allergies    Patient has no known allergies.   Review of Systems Review of Systems  Constitutional: Negative for chills and fever.  Respiratory: Negative.   Musculoskeletal: Negative for arthralgias, gait problem and joint swelling.  Skin: Positive for color change (nail color change).  Neurological: Negative.   Hematological: Negative.      Physical Exam Triage Vital Signs ED Triage Vitals  Enc Vitals Group     BP 08/24/19 1939 121/86     Pulse Rate 08/24/19 1939 83     Resp 08/24/19 1939 18     Temp 08/24/19 1939 98.2 F (36.8 C)     Temp Source 08/24/19 1939 Oral     SpO2 08/24/19 1939 98 %     Weight 08/24/19 1936 167 lb (75.8 kg)     Height --      Head Circumference --      Peak Flow --      Pain Score 08/24/19 1935 7     Pain Loc --      Pain Edu? --      Excl. in [place]? --    No data found.  Updated Vital Signs BP  121/86 (BP Location: Right Arm)   Pulse 83   Temp 98.2 F (36.8 C) (Oral)   Resp 18   Wt 167 lb (75.8 kg)   SpO2 98%   BMI 20.87 kg/m   Visual Acuity Right Eye Distance:   Left Eye Distance:   Bilateral Distance:    Right Eye Near:   Left Eye Near:    Bilateral Near:     Physical Exam Vitals and nursing note reviewed.  Constitutional:      General: He is not in acute distress.    Appearance: Normal appearance. He is well-developed. He is not ill-appearing.  HENT:     Head: Normocephalic and atraumatic.  Eyes:     Conjunctiva/sclera: Conjunctivae normal.     Pupils: Pupils are equal, round, and reactive to light.  Cardiovascular:     Rate and Rhythm: Normal rate.  Pulmonary:     Effort: Pulmonary effort is normal. No respiratory distress.  Abdominal:     Palpations: Abdomen is soft.     Tenderness: There is no abdominal tenderness.  Musculoskeletal:     Cervical back: Neck supple.  Feet:     Right foot:     Toenail Condition: Fungal disease present.    Left foot:     Toenail Condition: Fungal disease present.    Comments: Right great  toe with nail thickening and flaking. Discoloration and brittle texture.  Left great toe with some evidence of early thickening and brittleness with discoloration. Skin:    General: Skin is warm and dry.  Neurological:     General: No focal deficit present.     Mental Status: He is alert and oriented to person, place, and time.  Psychiatric:        Mood and Affect: Mood normal.        Behavior: Behavior normal.        Thought Content: Thought content normal.        Judgment: Judgment normal.      UC Treatments / Results  Labs (all labs ordered are listed, but only abnormal results are displayed) Labs Reviewed - No data to display  EKG   Radiology No results found.  Procedures Procedures (including critical care time)  Medications Ordered in UC Medications - No data to display  Initial Impression / Assessment and Plan / UC Course  I have reviewed the triage vital signs and the nursing notes.  Pertinent labs & imaging results that were available during my care of the patient were reviewed by me and considered in my medical decision making (see chart for details).    #Onychyomycosis Of Right Great toe - Exam and history consistent with fungal infection. Terbinafine 250mg  daily for 12 weeks per upToDate recommendations.  - discussed side effects and to call if he can not tolerate.   Final Clinical Impressions(s) / UC Diagnoses   Final diagnoses:  Fungal infection of toenail     Discharge Instructions     Take this medication for 12 weeks, 1 time a day.        ED Prescriptions    Medication Sig Dispense Auth. Provider   terbinafine (LAMISIL) 250 MG tablet Take 1 tablet (250 mg total) by mouth daily. 84 tablet Chrislyn Seedorf, , [name]     PDMP not reviewed this encounter.   Veryl Speak, [name] 08/24/19 2049    Loyda Costin, 2050, [name] 08/24/19 2051

## 2019-09-20 ENCOUNTER — Telehealth: Payer: Medicare HMO

## 2019-10-06 ENCOUNTER — Ambulatory Visit (HOSPITAL_COMMUNITY)
Admission: EM | Admit: 2019-10-06 | Discharge: 2019-10-06 | Disposition: A | Discharge status: Home / Self Care | Payer: Medicare HMO | Attending: Emergency Medicine | Admitting: Emergency Medicine

## 2019-10-06 ENCOUNTER — Other Ambulatory Visit: Payer: Self-pay

## 2019-10-06 ENCOUNTER — Encounter (HOSPITAL_COMMUNITY): Payer: Self-pay

## 2019-10-06 DIAGNOSIS — B351 Tinea unguium: Secondary | ICD-10-CM

## 2019-10-06 MED ORDER — CICLOPIROX 8 % EX KIT: 1.0000 "application " | PACK | Freq: Every day | CUTANEOUS | 3 refills | Status: DC

## 2019-10-06 NOTE — Discharge Instructions (Addendum)
I would discontinue prune juice, start a probiotic with acidophilus bacteria in it.  You can try discontinuing dairy and try oat milk or some other dairy alternative.  I would continue the terbinafine if you are able to tolerate it.  apply the ciclopirox once daily to the affected nail, 5 mm of surrounding skin, and to the nail bed,  and undersurface of the nail plate if possible. The nail is wiped clean with alcohol once weekly. Treatment is continued until nail clearance or up to 48 weeks  [facility]up with [facility] for further management [address] [place], [place], [place] [address]  [phone]

## 2019-10-06 NOTE — ED Provider Notes (Signed)
HPI  SUBJECTIVE:  Jordan Nelson is a 69 y.o. male who presents with loose stools starting several days ago.  States that he is having 1-2 episodes a day.  It is not bloody or watery. Patient was seen here on 1/12 for right toe pain found to have onychomycosis was sent home with terbinafine 250 mg daily for 12 weeks.  States that he was taking it without any problem for 3 weeks and he states that the pain is getting much better.  Patient has been drinking prune juice and has increased his fiber intake to help combat constipation. He denies abdominal pain, vomiting, nausea, fevers.  He plans on trying to modify his diet to a brat diet, start yogurt but has not tried anything specifically for his symptoms.  There are no other aggravating or alleviating factors.  He has a past medical history of tuberculosis.  No history of psoriasis, diabetes.  PMD: At the [place].  Past Medical History:  Diagnosis Date  . Arthritis   . Chronic back pain   . Epigastric hernia   . GERD (gastroesophageal reflux disease)   . Numbness    right hand  . Pollen allergy   . Scoliosis    " minor"  . Tuberculosis    early 1980's  . Wears glasses     Past Surgical History:  Procedure Laterality Date  . EYE SURGERY    . HERNIA REPAIR      Family History  Problem Relation Age of Onset  . Hypertension Mother     Social History   Tobacco Use  . Smoking status: Former Smoker    Types: Cigarettes  . Smokeless tobacco: Never Used  Substance Use Topics  . Alcohol use: No  . Drug use: No    No current facility-administered medications for this encounter.  Current Outpatient Medications:  .  cholecalciferol (VITAMIN D3) 25 MCG (1000 UT) tablet, Take 1,000 Units by mouth daily., Disp: , Rfl:  .  Ciclopirox 8 % KIT, Apply 1 application topically daily., Disp: 34.6 mL, Rfl: 3 .  clotrimazole (LOTRIMIN) 1 % cream, Apply to affected area 2 times daily, Disp: 30 g, Rfl: 0 .  clotrimazole-betamethasone (LOTRISONE)  cream, Apply to affected area 2 times daily prn, Disp: 45 g, Rfl: 0 .  terbinafine (LAMISIL) 250 MG tablet, Take 1 tablet (250 mg total) by mouth daily., Disp: 84 tablet, Rfl: 0  No Known Allergies   ROS  As noted in HPI.   Physical Exam  BP 136/83 (BP Location: Left Arm)   Pulse 79   Temp 98.7 F (37.1 C) (Oral)   Resp 17   SpO2 97%   Constitutional: Well developed, well nourished, no acute distress Eyes:  EOMI, conjunctiva normal bilaterally HENT: Normocephalic, atraumatic,mucus membranes moist Respiratory: Normal inspiratory effort Cardiovascular: Normal rate GI: nondistended skin: No rash, skin intact Musculoskeletal: Onchomycosis first right toe.  No surrounding erythema, edema.      Neurologic: Alert & oriented x 3, no focal neuro deficits Psychiatric: Speech and behavior appropriate   ED Course   Medications - No data to display  No orders of the defined types were placed in this encounter.   No results found for this or any previous visit (from the past 24 hour(s)). No results found.  ED Clinical Impression  1. Onychomycosis of great toe      ED Assessment/Plan  Previous records reviewed.  As noted in HPI.  Patient is reporting loose stools rather than true  diarrhea.  I am not completely convinced that the terbinafine is the cause of his symptoms as he did recently restart prune juice and he was tolerating the terbinafine without any problems for several weeks.  However we can try ciclopirox 8% once daily, discussed with him that he will need to use this for 48 weeks, and have him continue the terbinafine if he tolerates it.  Had a long discussion about how to help diarrhea with dietary changes.  We will have him add probiotics, discontinue dairy, discontinue prune juice.  [facility].  Discussed MDM, treatment plan, and plan for follow-up with patient. . patient agrees with plan.   Meds ordered  this encounter  Medications  . Ciclopirox 8 % KIT    Sig: Apply 1 application topically daily.    Dispense:  34.6 mL    Refill:  3    *This clinic note was created using [employer]. Therefore, there may be occasional mistakes despite careful proofreading.   ?    Melynda Ripple, MD 10/06/19 2035

## 2019-10-06 NOTE — ED Triage Notes (Signed)
Pt presents for follow up with issue with right big toe; pt states his last visit he was given an oral anti fungal and he would like it switched to a cream if possible because the oral medication is giving him diarrhea.

## 2019-10-14 ENCOUNTER — Telehealth (HOSPITAL_COMMUNITY): Payer: Self-pay | Admitting: Emergency Medicine

## 2019-10-14 MED ORDER — FLUCONAZOLE 150 MG PO TABS: 150.0000 mg | ORAL_TABLET | ORAL | 5 refills | Status: DC

## 2019-10-14 NOTE — Telephone Encounter (Signed)
Pt states he needs a different medicine, not covered by insurance. Per dr. Tracie Harrier, send Diflucan 150mg  once weekly for 6 months #24. Pt made aware.

## 2019-10-17 ENCOUNTER — Telehealth (HOSPITAL_COMMUNITY): Payer: Self-pay

## 2019-11-24 ENCOUNTER — Telehealth (HOSPITAL_COMMUNITY): Payer: Self-pay

## 2019-11-24 NOTE — Telephone Encounter (Signed)
Pt called requesting further information of probiotic recommendations per AVS that contains acidophilus. Recommended pt call [employer], [employer], or natural food store that carries probiotic that contains the acidophilus. Pt verbalized understanding.

## 2019-12-24 ENCOUNTER — Other Ambulatory Visit: Payer: Self-pay

## 2019-12-24 ENCOUNTER — Ambulatory Visit (HOSPITAL_COMMUNITY)
Admission: EM | Admit: 2019-12-24 | Discharge: 2019-12-24 | Disposition: A | Discharge status: Home / Self Care | Payer: Medicare HMO | Attending: Family Medicine | Admitting: Family Medicine

## 2019-12-24 ENCOUNTER — Encounter (HOSPITAL_COMMUNITY): Payer: Self-pay

## 2019-12-24 DIAGNOSIS — M545 Low back pain, unspecified: Secondary | ICD-10-CM

## 2019-12-24 DIAGNOSIS — M4125 Other idiopathic scoliosis, thoracolumbar region: Secondary | ICD-10-CM

## 2019-12-24 DIAGNOSIS — B351 Tinea unguium: Secondary | ICD-10-CM

## 2019-12-24 DIAGNOSIS — M47896 Other spondylosis, lumbar region: Secondary | ICD-10-CM

## 2019-12-24 DIAGNOSIS — G8929 Other chronic pain: Secondary | ICD-10-CM | POA: Diagnosis not present

## 2019-12-24 DIAGNOSIS — M47816 Spondylosis without myelopathy or radiculopathy, lumbar region: Secondary | ICD-10-CM

## 2019-12-24 DIAGNOSIS — M419 Scoliosis, unspecified: Secondary | ICD-10-CM

## 2019-12-24 DIAGNOSIS — B353 Tinea pedis: Secondary | ICD-10-CM

## 2019-12-24 MED ORDER — TIZANIDINE HCL 4 MG PO TABS: 4.0000 mg | ORAL_TABLET | Freq: Four times a day (QID) | ORAL | 1 refills | Status: AC | PRN

## 2019-12-24 MED ORDER — MELOXICAM 15 MG PO TABS: 15.0000 mg | ORAL_TABLET | Freq: Every day | ORAL | 1 refills | Status: DC

## 2019-12-24 MED ORDER — FLUCONAZOLE 150 MG PO TABS: 150.0000 mg | ORAL_TABLET | ORAL | 5 refills | Status: DC

## 2019-12-24 NOTE — ED Provider Notes (Signed)
[facility]    CSN: [id number] Arrival date & time: 12/24/19  1814      History   Chief Complaint Chief Complaint  Patient presents with  . Back Pain    HPI Jordan Nelson is a 69 y.o. male.   HPI  Patient is here for chronic low back pain He has a history of scoliosis and states this is caused arthritis in his back He also gets muscle spasms in his back He tries to remain active, he walks every day, he tries to do stretching exercises but his back limits his ability to exercise and remain active He is not on any medication for his back He does not have any pain that goes into his legs, no numbness or weakness, no bowel or bladder complaint He has never had any surgery on his back  He also has chronic tinea infections.  Tinea pedis and infections of his toenails.  He is currently on Diflucan once a week for 6 months.  He is also on probiotics   Past Medical History:  Diagnosis Date  . Arthritis   . Chronic back pain   . Epigastric hernia   . GERD (gastroesophageal reflux disease)   . Numbness    right hand  . Pollen allergy   . Scoliosis    " minor"  . Tuberculosis    early 1980's  . Wears glasses     Patient Active Problem List   Diagnosis Date Noted  . Tinea pedis, recurrent 12/24/2019  . Onychomycosis of toenail 12/24/2019  . Scoliosis of lumbar spine 12/24/2019  . Degenerative joint disease (DJD) of lumbar spine 12/24/2019    Past Surgical History:  Procedure Laterality Date  . EYE SURGERY    . HERNIA REPAIR         Home Medications    Prior to Admission medications   Medication Sig Start Date End Date Taking? Authorizing Provider  cholecalciferol (VITAMIN D3) 25 MCG (1000 UT) tablet Take 1,000 Units by mouth daily.    [provider]  fluconazole (DIFLUCAN) 150 MG tablet Take 1 tablet (150 mg total) by mouth once a week for 24 doses. 12/24/19 06/03/20  Eustace Moore, MD  meloxicam (MOBIC) 15 MG tablet Take 1 tablet  (15 mg total) by mouth daily. 12/24/19   Eustace Moore, MD  tiZANidine (ZANAFLEX) 4 MG tablet Take 1-2 tablets (4-8 mg total) by mouth every 6 (six) hours as needed for muscle spasms. 12/24/19   Eustace Moore, MD    Family History Family History  Problem Relation Age of Onset  . Hypertension Mother     Social History Social History   Tobacco Use  . Smoking status: Former Smoker    Types: Cigarettes  . Smokeless tobacco: Never Used  Substance Use Topics  . Alcohol use: No  . Drug use: No     Allergies   Patient has no known allergies.   Review of Systems Review of Systems  Musculoskeletal: Positive for back pain and gait problem.  Neurological: Negative for weakness and numbness.     Physical Exam Triage Vital Signs ED Triage Vitals  Enc Vitals Group     BP 12/24/19 1830 122/80     Pulse Rate 12/24/19 1830 80     Resp 12/24/19 1830 16     Temp 12/24/19 1830 98.2 F (36.8 C)     Temp Source 12/24/19 1830 Oral     SpO2 12/24/19 1830 100 %  Weight --      Height --      Head Circumference --      Peak Flow --      Pain Score 12/24/19 1832 8     Pain Loc --      Pain Edu? --      Excl. in [place]? --    No data found.  Updated Vital Signs BP 122/80 (BP Location: Left Arm)   Pulse 80   Temp 98.2 F (36.8 C) (Oral)   Resp 16   SpO2 100%  :     Physical Exam Constitutional:      General: He is not in acute distress.    Appearance: Normal appearance. He is well-developed.     Comments: Patient is lean  HENT:     Head: Normocephalic and atraumatic.     Mouth/Throat:     Comments: Mask is in place Eyes:     Conjunctiva/sclera: Conjunctivae normal.     Pupils: Pupils are equal, round, and reactive to light.  Cardiovascular:     Rate and Rhythm: Normal rate.  Pulmonary:     Effort: Pulmonary effort is normal. No respiratory distress.  Abdominal:     General: There is no distension.     Palpations: Abdomen is soft.  Musculoskeletal:         General: Normal range of motion.     Cervical back: Normal range of motion.     Comments: Patient has a significant thoracoscoliosis.  He has muscle spasms right greater than left low lumbar muscles.  He has slow but full range of motion.  Strength sensation range of motion reflexes are normal in both lower extremities  Skin:    General: Skin is warm and dry.  Neurological:     General: No focal deficit present.     Mental Status: He is alert.     Motor: No weakness.     Coordination: Coordination normal.     Gait: Gait normal.     Deep Tendon Reflexes: Reflexes normal.  Psychiatric:        Mood and Affect: Mood normal.        Behavior: Behavior normal.      UC Treatments / Results  Labs (all labs ordered are listed, but only abnormal results are displayed) Labs Reviewed - No data to display  EKG   Radiology No results found.  Procedures Procedures (including critical care time)  Medications Ordered in UC Medications - No data to display  Initial Impression / Assessment and Plan / UC Course  I have reviewed the triage vital signs and the nursing notes.  Pertinent labs & imaging results that were available during my care of the patient were reviewed by me and considered in my medical decision making (see chart for details).     Chronic low back pain likely from degenerative changes as a result of his chronic lumbar scoliosis.  Discussed conservative treatment with NSAID, muscle relaxers, activity modifications.  Recommend spine specialist if he fails to improve Final Clinical Impressions(s) / UC Diagnoses   Final diagnoses:  Other idiopathic scoliosis, thoracolumbar region  Other osteoarthritis of spine, lumbar region  Chronic bilateral low back pain without sciatica     Discharge Instructions     Take meloxicam once a day with food Do not take ibuprofen (Advil) or naproxen (Aleve) while on meloxicam You may take Tylenol if needed for pain Take tizanidine if  needed as a muscle relaxer This is  useful at bedtime Be careful taking during the day as it occasionally causes drowsiness Use ice or heat to back Activity as tolerated, avoid activities that aggravate your back Sleep on your side with legs bent or on your back with a pillow under your knees   ED Prescriptions    Medication Sig Dispense Auth. Provider   meloxicam (MOBIC) 15 MG tablet Take 1 tablet (15 mg total) by mouth daily. 30 tablet Raylene Everts, MD   tiZANidine (ZANAFLEX) 4 MG tablet Take 1-2 tablets (4-8 mg total) by mouth every 6 (six) hours as needed for muscle spasms. 30 tablet Raylene Everts, MD     PDMP not reviewed this encounter.   Raylene Everts, MD 12/24/19 1924

## 2019-12-24 NOTE — Discharge Instructions (Addendum)
Take meloxicam once a day with food Do not take ibuprofen (Advil) or naproxen (Aleve) while on meloxicam You may take Tylenol if needed for pain Take tizanidine if needed as a muscle relaxer This is useful at bedtime Be careful taking during the day as it occasionally causes drowsiness Use ice or heat to back Activity as tolerated, avoid activities that aggravate your back Sleep on your side with legs bent or on your back with a pillow under your knees

## 2019-12-24 NOTE — ED Triage Notes (Signed)
Pt presents with lower back pain X 1 week.

## 2020-01-05 ENCOUNTER — Telehealth (HOSPITAL_COMMUNITY): Payer: Self-pay | Admitting: Emergency Medicine

## 2020-01-05 ENCOUNTER — Ambulatory Visit (HOSPITAL_COMMUNITY)
Admission: EM | Admit: 2020-01-05 | Discharge: 2020-01-05 | Disposition: A | Discharge status: Home / Self Care | Payer: Medicare HMO | Attending: Urgent Care | Admitting: Urgent Care

## 2020-01-05 ENCOUNTER — Encounter (HOSPITAL_COMMUNITY): Payer: Self-pay | Admitting: Emergency Medicine

## 2020-01-05 DIAGNOSIS — R39198 Other difficulties with micturition: Secondary | ICD-10-CM

## 2020-01-05 DIAGNOSIS — R3915 Urgency of urination: Secondary | ICD-10-CM

## 2020-01-05 DIAGNOSIS — N390 Urinary tract infection, site not specified: Secondary | ICD-10-CM | POA: Diagnosis not present

## 2020-01-05 DIAGNOSIS — N400 Enlarged prostate without lower urinary tract symptoms: Secondary | ICD-10-CM

## 2020-01-05 DIAGNOSIS — R399 Unspecified symptoms and signs involving the genitourinary system: Secondary | ICD-10-CM

## 2020-01-05 LAB — POCT URINALYSIS DIP (DEVICE)
Bilirubin Urine: NEGATIVE
Glucose, UA: NEGATIVE mg/dL
Hgb urine dipstick: NEGATIVE
Ketones, ur: NEGATIVE mg/dL
Leukocytes,Ua: NEGATIVE
Nitrite: NEGATIVE
Protein, ur: NEGATIVE mg/dL
Specific Gravity, Urine: 1.025 (ref 1.005–1.030)
Urobilinogen, UA: 0.2 mg/dL (ref 0.0–1.0)
pH: 7 (ref 5.0–8.0)

## 2020-01-05 MED ORDER — TAMSULOSIN HCL 0.4 MG PO CAPS: 0.4000 mg | ORAL_CAPSULE | Freq: Every day | ORAL | 0 refills | Status: DC

## 2020-01-05 NOTE — ED Triage Notes (Signed)
Pt c/o urinary urgency but when he goes to urinate he is not able to right away. Pt denies cloudy urine or any discharge.

## 2020-01-05 NOTE — ED Provider Notes (Signed)
[facility]   MRN: [id number] DOB: 1951/05/12  Subjective:   Jordan Nelson is a 69 y.o. male presenting for difficulty urinating. He has had the urgency to urinate but can take a while to actually urinate. Denies dysuria, hematuria, urinary frequency, penile discharge, penile swelling, testicular pain, testicular swelling, anal pain, groin pain. He gets worked up through the [place] and actually has a "scan" coming up soon. No hx of prostate issues per patient.    No current facility-administered medications for this encounter.  Current Outpatient Medications:  .  cholecalciferol (VITAMIN D3) 25 MCG (1000 UT) tablet, Take 1,000 Units by mouth daily., Disp: , Rfl:  .  meloxicam (MOBIC) 15 MG tablet, Take 1 tablet (15 mg total) by mouth daily., Disp: 30 tablet, Rfl: 1 .  tiZANidine (ZANAFLEX) 4 MG tablet, Take 1-2 tablets (4-8 mg total) by mouth every 6 (six) hours as needed for muscle spasms., Disp: 30 tablet, Rfl: 1 .  fluconazole (DIFLUCAN) 150 MG tablet, Take 1 tablet (150 mg total) by mouth once a week for 24 doses., Disp: 4 tablet, Rfl: 5   No Known Allergies  Past Medical History:  Diagnosis Date  . Arthritis   . Chronic back pain   . Epigastric hernia   . GERD (gastroesophageal reflux disease)   . Numbness    right hand  . Pollen allergy   . Scoliosis    " minor"  . Tuberculosis    early 1980's  . Wears glasses      Past Surgical History:  Procedure Laterality Date  . EYE SURGERY    . HERNIA REPAIR      Family History  Problem Relation Age of Onset  . Hypertension Mother     Social History   Tobacco Use  . Smoking status: Former Smoker    Types: Cigarettes  . Smokeless tobacco: Never Used  Substance Use Topics  . Alcohol use: No  . Drug use: No    ROS   Objective:   Vitals: BP 121/77 (BP Location: Left Arm)   Pulse 60   Temp 98.5 F (36.9 C) (Oral)   Resp 15   SpO2 100%   Physical Exam Constitutional:      General: He is not in  acute distress.    Appearance: Normal appearance. He is well-developed and normal weight. He is not ill-appearing, toxic-appearing or diaphoretic.  HENT:     Head: Normocephalic and atraumatic.     Right Ear: External ear normal.     Left Ear: External ear normal.     Nose: Nose normal.     Mouth/Throat:     Pharynx: Oropharynx is clear.  Eyes:     General: No scleral icterus.       Right eye: No discharge.        Left eye: No discharge.     Extraocular Movements: Extraocular movements intact.     Pupils: Pupils are equal, round, and reactive to light.  Cardiovascular:     Rate and Rhythm: Normal rate.  Pulmonary:     Effort: Pulmonary effort is normal.  Musculoskeletal:     Cervical back: Normal range of motion.  Neurological:     Mental Status: He is alert and oriented to person, place, and time.  Psychiatric:        Mood and Affect: Mood normal.        Behavior: Behavior normal.        Thought Content: Thought content normal.  Judgment: Judgment normal.     Results for orders placed or performed during the hospital encounter of 01/05/20 (from the past 24 hour(s))  POCT urinalysis dip (device)     Status: None   Collection Time: 01/05/20  6:26 PM  Result Value Ref Range   Glucose, UA NEGATIVE NEGATIVE mg/dL   Bilirubin Urine NEGATIVE NEGATIVE   Ketones, ur NEGATIVE NEGATIVE mg/dL   Specific Gravity, Urine 1.025 1.005 - 1.030   Hgb urine dipstick NEGATIVE NEGATIVE   pH 7.0 5.0 - 8.0   Protein, ur NEGATIVE NEGATIVE mg/dL   Urobilinogen, UA 0.2 0.0 - 1.0 mg/dL   Nitrite NEGATIVE NEGATIVE   Leukocytes,Ua NEGATIVE NEGATIVE   CLINICAL DATA:  69 year old male with abdominal pain.  Known hernia.  EXAM: CT ABDOMEN AND PELVIS WITH CONTRAST  TECHNIQUE: Multidetector CT imaging of the abdomen and pelvis was performed using the standard protocol following bolus administration of intravenous contrast.  CONTRAST:  144mL ISOVUE-300 IOPAMIDOL (ISOVUE-300) INJECTION  61%  COMPARISON:  Abdominal CT dated 11/13/2011  FINDINGS: Lower chest: The visualized lung bases are clear.  No intra-abdominal free air or free fluid.  Hepatobiliary: No focal liver abnormality is seen. No gallstones, gallbladder wall thickening, or biliary dilatation.  Pancreas: Unremarkable. No pancreatic ductal dilatation or surrounding inflammatory changes.  Spleen: Normal in size without focal abnormality.  Adrenals/Urinary Tract: Adrenal glands are unremarkable. Kidneys are normal, without renal calculi, focal lesion, or hydronephrosis. Bladder is unremarkable.  Stomach/Bowel: Normal caliber fluid-filled loops of small bowel, likely physiologic. Enteritis is less likely. Clinical correlation is recommended. There is moderate stool throughout the colon. There is no bowel obstruction or active inflammation. The appendix is normal.  Vascular/Lymphatic: There is mild aortoiliac atherosclerotic disease. No portal venous gas. There is no adenopathy.  Reproductive: Enlarged prostate gland measuring approximately 6 cm in transverse diameter.  Other: Small fat containing supraumbilical hernia with small amount of fluid may represent a degree of inflammation. Correlation with clinical exam recommended.  Musculoskeletal: Degenerative changes of the spine and scoliosis. There is loss of subcutaneous fat and cachexia.  IMPRESSION: 1. Fluid-filled loops of small bowel, likely physiologic. Clinical correlation is recommended. No bowel obstruction. Normal appendix. 2. Small supraumbilical fat containing hernia. Small amount of fluid within this hernia may represent inflammatory changes. Clinical correlation is recommended. 3. Other nonemergent and nonacute findings as above.   Electronically Signed   By: Anner Crete M.D.   On: 10/23/2017 01:21   Assessment and Plan :   PDMP not reviewed this encounter.  1. Urinary urgency   2. Difficulty  urinating   3. Enlarged prostate   4. Lower urinary tract symptoms (LUTS)     Start Flomax. Request referral through the [facility] for consult with urologist. Counseled patient on potential for adverse effects with medications prescribed/recommended today, ER and return-to-clinic precautions discussed, patient verbalized understanding.    Jaynee Eagles, [place] 01/05/20 1842

## 2020-02-28 ENCOUNTER — Ambulatory Visit (HOSPITAL_COMMUNITY)
Admission: EM | Admit: 2020-02-28 | Discharge: 2020-02-28 | Disposition: A | Discharge status: Home / Self Care | Payer: Medicare HMO | Attending: Family Medicine | Admitting: Family Medicine

## 2020-02-28 ENCOUNTER — Other Ambulatory Visit: Payer: Self-pay

## 2020-02-28 ENCOUNTER — Encounter (HOSPITAL_COMMUNITY): Payer: Self-pay

## 2020-02-28 DIAGNOSIS — M79671 Pain in right foot: Secondary | ICD-10-CM | POA: Diagnosis not present

## 2020-02-28 HISTORY — DX: Venous insufficiency (chronic) (peripheral): I87.2

## 2020-02-28 HISTORY — DX: Benign prostatic hyperplasia without lower urinary tract symptoms: N40.0

## 2020-02-28 NOTE — Discharge Instructions (Signed)
Your exam is overall reassuring tonight, I am not seeing an obvious reason for your symptoms.  Continue with tylenol to help with your back pain to ensure your back is not the source of your foot pain.  Elevate your feet, especially after increased activity.  Continue with your compression socks.  Use of lotion to dry skin.  If persistent symptoms please follow up with your PCP.  If worsening of symptoms- noted to inner thighs, incontinence of stool or urine, weakness to legs or feet, or otherwise worsening please return.

## 2020-02-28 NOTE — ED Provider Notes (Addendum)
[facility]    CSN: [id number] Arrival date & time: 02/28/20  1809      History   Chief Complaint Chief Complaint  Patient presents with  . Foot Pain    HPI Jordan Nelson is a 69 y.o. male.   Jordan Nelson presents with complaints of sensation of "pins sticking into" foot. Noted it last night while in the shower, when water touched the affected area he noted. Today it has persisted. No injury to the foot. Only feels it if the area is touched. No redness or warmth. No numbness. History of venous insufficiency, he wears compression socks. No pain with walking. No throbbing or aching sensation. He is not diabetic. History of back pain but this is currently controlled with prn tylenol. No saddle symptoms. No bowel or bladder incontinence. His PCP is with the [place].    ROS per HPI, negative if not otherwise mentioned.      Past Medical History:  Diagnosis Date  . Arthritis   . Benign enlargement of prostate   . Chronic back pain   . Epigastric hernia   . GERD (gastroesophageal reflux disease)   . Numbness    right hand  . Pollen allergy   . Scoliosis    " minor"  . Tuberculosis    early 1980's  . Venous insufficiency (chronic) (peripheral)   . Wears glasses     Patient Active Problem List   Diagnosis Date Noted  . Tinea pedis, recurrent 12/24/2019  . Onychomycosis of toenail 12/24/2019  . Scoliosis of lumbar spine 12/24/2019  . Degenerative joint disease (DJD) of lumbar spine 12/24/2019    Past Surgical History:  Procedure Laterality Date  . EYE SURGERY    . HERNIA REPAIR         Home Medications    Prior to Admission medications   Medication Sig Start Date End Date Taking? Authorizing Provider  cholecalciferol (VITAMIN D3) 25 MCG (1000 UT) tablet Take 1,000 Units by mouth daily.    [provider]  meloxicam (MOBIC) 15 MG tablet Take 1 tablet (15 mg total) by mouth daily. 12/24/19   Eustace Moore, MD  tamsulosin (FLOMAX) 0.4 MG  CAPS capsule Take 1 capsule (0.4 mg total) by mouth daily after supper. 01/05/20   Wallis Bamberg, [name]  tiZANidine (ZANAFLEX) 4 MG tablet Take 1-2 tablets (4-8 mg total) by mouth every 6 (six) hours as needed for muscle spasms. 12/24/19   Eustace Moore, MD    Family History Family History  Problem Relation Age of Onset  . Hypertension Mother     Social History Social History   Tobacco Use  . Smoking status: Former Smoker    Types: Cigarettes  . Smokeless tobacco: Never Used  Vaping Use  . Vaping Use: Never used  Substance Use Topics  . Alcohol use: No  . Drug use: No     Allergies   Patient has no known allergies.   Review of Systems Review of Systems   Physical Exam Triage Vital Signs ED Triage Vitals  Enc Vitals Group     BP 02/28/20 1936 134/73     Pulse Rate 02/28/20 1936 68     Resp 02/28/20 1936 16     Temp 02/28/20 1936 98.1 F (36.7 C)     Temp Source 02/28/20 1936 Oral     SpO2 02/28/20 1936 100 %     Weight 02/28/20 1937 167 lb (75.8 kg)     Height  02/28/20 1937 5\' 11"  (1.803 m)     Head Circumference --      Peak Flow --      Pain Score 02/28/20 1936 8     Pain Loc --      Pain Edu? --      Excl. in GC? --    No data found.  Updated Vital Signs BP 134/73   Pulse 68   Temp 98.1 F (36.7 C) (Oral)   Resp 16   Ht 5\' 11"  (1.803 m)   Wt 167 lb (75.8 kg)   SpO2 100%   BMI 23.29 kg/m   Visual Acuity Right Eye Distance:   Left Eye Distance:   Bilateral Distance:    Right Eye Near:   Left Eye Near:    Bilateral Near:     Physical Exam Constitutional:      Appearance: He is well-developed.  Cardiovascular:     Rate and Rhythm: Normal rate.     Pulses:          Dorsalis pedis pulses are 2+ on the right side.  Pulmonary:     Effort: Pulmonary effort is normal.  Musculoskeletal:     Right foot: Normal range of motion. No deformity.       Feet:     Comments: No saddle symptoms; strength equal bilaterally; gross sensation intact to  lower extremities  Feet:     Right foot:     Skin integrity: Dry skin present. No skin breakdown, erythema or warmth.     Comments: Right lateral foot, near atfl, with very specific point tenderness "like pins" with palpation; full ROM Of ankle and foot; no redness warmth or swelling; very dry skin with some cracking of skin but no significant fissures, no ulcers; strong pulse; cap refill < 2 seconds   Skin:    General: Skin is warm and dry.  Neurological:     Mental Status: He is alert and oriented to person, place, and time.      UC Treatments / Results  Labs (all labs ordered are listed, but only abnormal results are displayed) Labs Reviewed - No data to display  EKG   Radiology No results found.  Procedures Procedures (including critical care time)  Medications Ordered in UC Medications - No data to display  Initial Impression / Assessment and Plan / UC Course  I have reviewed the triage vital signs and the nursing notes.  Pertinent labs & imaging results that were available during my care of the patient were reviewed by me and considered in my medical decision making (see chart for details).     New onset of pins/needles sensation to right foot, very specific point affected, toes and rest of foot WNL. No injury. Started last night. History of venous insufficiency. No swelling. No warmth. Strong pulse. Not diabetic. No obvious source of symptoms at this time. Supportive cares recommended with return and follow up education provided. Patient verbalized understanding and agreeable to plan.  Ambulatory out of clinic without difficulty.    Final Clinical Impressions(s) / UC Diagnoses   Final diagnoses:  Foot pain, right     Discharge Instructions     Your exam is overall reassuring tonight, I am not seeing an obvious reason for your symptoms.  Continue with tylenol to help with your back pain to ensure your back is not the source of your foot pain.  Elevate your  feet, especially after increased activity.  Continue with your compression socks.  Use of lotion to dry skin.  If persistent symptoms please follow up with your PCP.  If worsening of symptoms- noted to inner thighs, incontinence of stool or urine, weakness to legs or feet, or otherwise worsening please return.    ED Prescriptions    None     PDMP not reviewed this encounter.   Georgetta Haber, NP 02/29/20 0724    Georgetta Haber, NP 02/29/20 0725

## 2020-02-28 NOTE — ED Triage Notes (Signed)
Pt c/o right foot/ankle pain started yesterday. Pt states when he was in the shower it "felt like pins were sticking in my ankle and foot." Pt walked well to exam room.

## 2020-07-21 ENCOUNTER — Ambulatory Visit (HOSPITAL_COMMUNITY)
Admission: EM | Admit: 2020-07-21 | Discharge: 2020-07-21 | Disposition: A | Discharge status: Home / Self Care | Payer: Medicare HMO

## 2020-07-21 ENCOUNTER — Other Ambulatory Visit: Payer: Self-pay

## 2020-07-21 ENCOUNTER — Encounter (HOSPITAL_COMMUNITY): Payer: Self-pay | Admitting: Emergency Medicine

## 2020-07-21 DIAGNOSIS — M5441 Lumbago with sciatica, right side: Secondary | ICD-10-CM | POA: Diagnosis not present

## 2020-07-21 NOTE — ED Triage Notes (Signed)
Patient c/o RT sided lower back pain radiating into RT leg that started today.   Patient endorses increased pain upon ambulation.   Patient hasn't tried any medications for the symptoms.   History of Arthritis.

## 2020-07-21 NOTE — Discharge Instructions (Addendum)
Continue taking the meloxicam and tizanidine as directed.    Follow-up with your primary care provider next week.

## 2020-07-21 NOTE — ED Provider Notes (Signed)
[facility]    CSN: [id number] Arrival date & time: 07/21/20  1344      History   Chief Complaint Chief Complaint  Patient presents with  . Back Pain    HPI Jordan Nelson is a 69 y.o. male.   Patient presents with right lower back pain radiating to his posterior right leg this morning.  No falls or injury.  He states his symptoms have improved after moving around today; currently 2/10; currently no pain in his right leg.  He denies numbness, weakness, saddle anesthesia, loss of control of bowel or bladder, fever, chills, abdominal pain, dysuria, or other symptoms.  No treatments attempted at home.  His medical history includes arthritis, chronic back pain, scoliosis, DJD.  The history is provided by the patient and medical records.    Past Medical History:  Diagnosis Date  . Arthritis   . Benign enlargement of prostate   . Chronic back pain   . Epigastric hernia   . GERD (gastroesophageal reflux disease)   . Numbness    right hand  . Pollen allergy   . Scoliosis    " minor"  . Tuberculosis    early 1980's  . Venous insufficiency (chronic) (peripheral)   . Wears glasses     Patient Active Problem List   Diagnosis Date Noted  . Tinea pedis, recurrent 12/24/2019  . Onychomycosis of toenail 12/24/2019  . Scoliosis of lumbar spine 12/24/2019  . Degenerative joint disease (DJD) of lumbar spine 12/24/2019    Past Surgical History:  Procedure Laterality Date  . EYE SURGERY    . HERNIA REPAIR         Home Medications    Prior to Admission medications   Medication Sig Start Date End Date Taking? Authorizing Provider  meloxicam (MOBIC) 15 MG tablet Take 1 tablet (15 mg total) by mouth daily. 12/24/19  Yes Eustace Moore, MD  tiZANidine (ZANAFLEX) 4 MG tablet Take 1-2 tablets (4-8 mg total) by mouth every 6 (six) hours as needed for muscle spasms. 12/24/19  Yes Eustace Moore, MD  cholecalciferol (VITAMIN D3) 25 MCG (1000 UT) tablet Take 1,000  Units by mouth daily.    [provider]  tamsulosin (FLOMAX) 0.4 MG CAPS capsule Take 1 capsule (0.4 mg total) by mouth daily after supper. 01/05/20   Wallis Bamberg, [name]    Family History Family History  Problem Relation Age of Onset  . Hypertension Mother     Social History Social History   Tobacco Use  . Smoking status: Former Smoker    Types: Cigarettes  . Smokeless tobacco: Never Used  Vaping Use  . Vaping Use: Never used  Substance Use Topics  . Alcohol use: No  . Drug use: No     Allergies   Patient has no known allergies.   Review of Systems Review of Systems  Constitutional: Negative for chills and fever.  HENT: Negative for ear pain and sore throat.   Eyes: Negative for pain and visual disturbance.  Respiratory: Negative for cough and shortness of breath.   Cardiovascular: Negative for chest pain and palpitations.  Gastrointestinal: Negative for abdominal pain and vomiting.  Genitourinary: Negative for dysuria and hematuria.  Musculoskeletal: Positive for back pain. Negative for arthralgias and gait problem.  Skin: Negative for color change and rash.  Neurological: Negative for seizures, syncope, weakness and numbness.  All other systems reviewed and are negative.    Physical Exam Triage Vital Signs ED Triage Vitals  Enc Vitals Group     BP 07/21/20 1456 (!) 145/91     Pulse Rate 07/21/20 1456 76     Resp 07/21/20 1456 19     Temp 07/21/20 1456 98 F (36.7 C)     Temp Source 07/21/20 1456 Oral     SpO2 07/21/20 1456 100 %     Weight 07/21/20 1452 187 lb (84.8 kg)     Height 07/21/20 1452 5\' 11"  (1.803 m)     Head Circumference --      Peak Flow --      Pain Score 07/21/20 1452 10     Pain Loc --      Pain Edu? --      Excl. in GC? --    No data found.  Updated Vital Signs BP (!) 145/91 (BP Location: Right Arm)   Pulse 76   Temp 98 F (36.7 C) (Oral)   Resp 19   Ht 5\' 11"  (1.803 m)   Wt 187 lb (84.8 kg)   SpO2 100%   BMI  26.08 kg/m   Visual Acuity Right Eye Distance:   Left Eye Distance:   Bilateral Distance:    Right Eye Near:   Left Eye Near:    Bilateral Near:     Physical Exam Vitals and nursing note reviewed.  Constitutional:      General: He is not in acute distress.    Appearance: He is well-developed and well-nourished. He is not ill-appearing.  HENT:     Head: Normocephalic and atraumatic.     Mouth/Throat:     Mouth: Mucous membranes are moist.  Eyes:     Conjunctiva/sclera: Conjunctivae normal.  Cardiovascular:     Rate and Rhythm: Normal rate and regular rhythm.     Heart sounds: No murmur heard.   Pulmonary:     Effort: Pulmonary effort is normal. No respiratory distress.     Breath sounds: Normal breath sounds.  Abdominal:     Palpations: Abdomen is soft.     Tenderness: There is no abdominal tenderness. There is no guarding or rebound.  Musculoskeletal:        General: No swelling, tenderness, deformity, signs of injury or edema. Normal range of motion.     Cervical back: Neck supple.  Skin:    General: Skin is warm and dry.     Capillary Refill: Capillary refill takes less than 2 seconds.     Findings: No bruising, erythema, lesion or rash.  Neurological:     General: No focal deficit present.     Mental Status: He is alert and oriented to person, place, and time.     Sensory: No sensory deficit.     Motor: No weakness.     Coordination: Coordination normal.     Gait: Gait normal.     Comments: No difficulty with ambulation.  Negative straight leg raise.  Psychiatric:        Mood and Affect: Mood and affect and mood normal.        Behavior: Behavior normal.      UC Treatments / Results  Labs (all labs ordered are listed, but only abnormal results are displayed) Labs Reviewed - No data to display  EKG   Radiology No results found.  Procedures Procedures (including critical care time)  Medications Ordered in UC Medications - No data to  display  Initial Impression / Assessment and Plan / UC Course  I have reviewed the triage vital signs  and the nursing notes.  Pertinent labs & imaging results that were available during my care of the patient were reviewed by me and considered in my medical decision making (see chart for details).   Acute right lower back pain with right side sciatica.  Patient symptoms have improved.  Instructed him to continue taking meloxicam and tizanidine as directed.  Instructed him to follow-up with his PCP next week.  He agrees to plan of care.  Final Clinical Impressions(s) / UC Diagnoses   Final diagnoses:  Acute right-sided low back pain with right-sided sciatica     Discharge Instructions     Continue taking the meloxicam and tizanidine as directed.    Follow-up with your primary care provider next week.        ED Prescriptions    None     I have reviewed the PDMP during this encounter.   Mickie Bail, NP 07/21/20 1626

## 2020-08-24 ENCOUNTER — Other Ambulatory Visit: Payer: Self-pay | Admitting: *Deleted

## 2020-08-24 DIAGNOSIS — R600 Localized edema: Secondary | ICD-10-CM

## 2020-09-11 ENCOUNTER — Ambulatory Visit (INDEPENDENT_AMBULATORY_CARE_PROVIDER_SITE_OTHER): Payer: No Typology Code available for payment source | Admitting: Physician Assistant

## 2020-09-11 ENCOUNTER — Other Ambulatory Visit: Payer: Self-pay

## 2020-09-11 ENCOUNTER — Ambulatory Visit (HOSPITAL_COMMUNITY)
Admission: RE | Admit: 2020-09-11 | Discharge: 2020-09-11 | Disposition: A | Discharge status: Home / Self Care | Payer: No Typology Code available for payment source | Source: Ambulatory Visit | Attending: Physician Assistant | Admitting: Physician Assistant

## 2020-09-11 VITALS — BP 126/80 | HR 71 | Temp 98.4°F | Resp 20 | Ht 71.0 in | Wt 182.9 lb

## 2020-09-11 DIAGNOSIS — R600 Localized edema: Secondary | ICD-10-CM

## 2020-09-11 DIAGNOSIS — I872 Venous insufficiency (chronic) (peripheral): Secondary | ICD-10-CM

## 2020-09-11 DIAGNOSIS — M7989 Other specified soft tissue disorders: Secondary | ICD-10-CM

## 2020-09-12 ENCOUNTER — Encounter: Payer: Self-pay | Admitting: Physician Assistant

## 2020-09-12 NOTE — Progress Notes (Signed)
Office Note     CC:  follow up Requesting Provider:  Clinic, [facility]  HPI: Jordan Nelson is a 70 y.o. (08-17-1950) male who presents for evaluation of BLE edema.  Patient states that he has noticed an increase in swelling and pigmentation changes of bilateral lower extremities especially over the past 2 years.  He has also been seen by wound clinic for Unna boots due to a traumatic injury of right shin.  This wound is since healed.  Patient wears knee-high compression stockings daily and periodically elevates his legs during the day.  He denies any history of venous stasis ulcers, DVT, or prior vascular intervention.  He does not have much pain related to edema however is like for reassurance that there is no underlying cause.  Past Medical History:  Diagnosis Date  . Arthritis   . Benign enlargement of prostate   . Chronic back pain   . Epigastric hernia   . GERD (gastroesophageal reflux disease)   . Numbness    right hand  . Pollen allergy   . Scoliosis    " minor"  . Tuberculosis    early 1980's  . Venous insufficiency (chronic) (peripheral)   . Wears glasses     Past Surgical History:  Procedure Laterality Date  . EYE SURGERY    . HERNIA REPAIR      Social History   Socioeconomic History  . Marital status: Single    Spouse name: Not on file  . Number of children: Not on file  . Years of education: Not on file  . Highest education level: Not on file  Occupational History  . Occupation: Semi-Retired  Tobacco Use  . Smoking status: Former Smoker    Types: Cigarettes  . Smokeless tobacco: Never Used  Vaping Use  . Vaping Use: Never used  Substance and Sexual Activity  . Alcohol use: No  . Drug use: No  . Sexual activity: Not on file  Other Topics Concern  . Not on file  Social History Narrative  . Not on file   Social Determinants of Health   Financial Resource Strain: Not on file  Food Insecurity: Not on file  Transportation Needs: Not on file   Physical Activity: Not on file  Stress: Not on file  Social Connections: Not on file  Intimate Partner Violence: Not on file    Family History  Problem Relation Age of Onset  . Hypertension Mother     Current Outpatient Medications  Medication Sig Dispense Refill  . finasteride (PROPECIA) 1 MG tablet Take 1 mg by mouth daily.    . meloxicam (MOBIC) 15 MG tablet Take 1 tablet (15 mg total) by mouth daily. 30 tablet 1  . tiZANidine (ZANAFLEX) 4 MG tablet Take 1-2 tablets (4-8 mg total) by mouth every 6 (six) hours as needed for muscle spasms. 30 tablet 1   No current facility-administered medications for this visit.    No Known Allergies   REVIEW OF SYSTEMS:   [X]  denotes positive finding, [ ]  denotes negative finding Cardiac  Comments:  Chest pain or chest pressure:    Shortness of breath upon exertion:    Short of breath when lying flat:    Irregular heart rhythm:        Vascular    Pain in calf, thigh, or hip brought on by ambulation:    Pain in feet at night that wakes you up from your sleep:     Blood clot in  your veins:    Leg swelling:         Pulmonary    Oxygen at home:    Productive cough:     Wheezing:         Neurologic    Sudden weakness in arms or legs:     Sudden numbness in arms or legs:     Sudden onset of difficulty speaking or slurred speech:    Temporary loss of vision in one eye:     Problems with dizziness:         Gastrointestinal    Blood in stool:     Vomited blood:         Genitourinary    Burning when urinating:     Blood in urine:        Psychiatric    Major depression:         Hematologic    Bleeding problems:    Problems with blood clotting too easily:        Skin    Rashes or ulcers:        Constitutional    Fever or chills:      PHYSICAL EXAMINATION:  Vitals:   09/11/20 1102  BP: 126/80  Pulse: 71  Resp: 20  Temp: 98.4 F (36.9 C)  TempSrc: Temporal  SpO2: 97%  Weight: 182 lb 14.4 oz (83 kg)  Height:  5\' 11"  (1.803 m)    General:  WDWN in NAD; vital signs documented above Gait: Not observed HENT: WNL, normocephalic Pulmonary: normal non-labored breathing Cardiac: regular HR Abdomen: soft, NT, no masses Skin: without rashes Vascular Exam/Pulses:  Right Left  Radial 2+ (normal) 2+ (normal)  DP 2+ (normal) 2+ (normal)  PT 2+ (normal) 2+ (normal)   Extremities: Pitting edema to the level of the mid shin bilaterally; stasis pigmentation changes of bilateral lower extremities; no visible varicosities Musculoskeletal: no muscle wasting or atrophy  Neurologic: A&O X 3;  No focal weakness or paresthesias are detected Psychiatric:  The pt has Normal affect.   Non-Invasive Vascular Imaging:   Venous reflux study negative for DVT bilaterally R Reflux in CFV Reflux in GSV in the calf Diameter of GSV less than 4 mm  L Reflux in CFV Reflux at saphenofemoral junction Diameter of GSV less than 4 mm    ASSESSMENT/PLAN:: 70 y.o. male here for evaluation of bilateral lower extremity edema  Patient does have evidence of venous insufficiency on physical exam with stasis pigmentation changes however venous reflux duplex only demonstrates mild deep and superficial venous reflux with a less than 4 mm diameter of GSV bilaterally.  No indication for further work-up and patient would not be a candidate for laser ablation therapy at this time.  Recommendations include knee-high 15 to 20 mmHg compression stockings to be worn daily as well as periodic elevation of the legs.  He will also avoid prolonged sitting and standing when possible.  Patient is also without evidence of arterial insufficiency with easily palpable pedal pulses on exam.  He may follow-up on an as-needed basis.   78, [name] [facility] [phone]  Clinic MD:   [phone]

## 2020-10-17 DIAGNOSIS — R030 Elevated blood-pressure reading, without diagnosis of hypertension: Secondary | ICD-10-CM | POA: Insufficient documentation

## 2021-06-12 ENCOUNTER — Other Ambulatory Visit: Payer: Self-pay

## 2021-06-12 DIAGNOSIS — I739 Peripheral vascular disease, unspecified: Secondary | ICD-10-CM

## 2021-06-14 ENCOUNTER — Ambulatory Visit (HOSPITAL_COMMUNITY)
Admission: RE | Admit: 2021-06-14 | Discharge: 2021-06-14 | Disposition: A | Discharge status: Home / Self Care | Payer: No Typology Code available for payment source | Source: Ambulatory Visit | Attending: Vascular Surgery | Admitting: Vascular Surgery

## 2021-06-14 ENCOUNTER — Ambulatory Visit (INDEPENDENT_AMBULATORY_CARE_PROVIDER_SITE_OTHER): Payer: No Typology Code available for payment source | Admitting: Physician Assistant

## 2021-06-14 ENCOUNTER — Other Ambulatory Visit: Payer: Self-pay

## 2021-06-14 VITALS — BP 124/75 | HR 66 | Temp 97.8°F | Resp 20 | Ht 71.0 in | Wt 168.3 lb

## 2021-06-14 DIAGNOSIS — R200 Anesthesia of skin: Secondary | ICD-10-CM

## 2021-06-14 DIAGNOSIS — D649 Anemia, unspecified: Secondary | ICD-10-CM | POA: Insufficient documentation

## 2021-06-14 DIAGNOSIS — I8310 Varicose veins of unspecified lower extremity with inflammation: Secondary | ICD-10-CM | POA: Insufficient documentation

## 2021-06-14 DIAGNOSIS — N401 Enlarged prostate with lower urinary tract symptoms: Secondary | ICD-10-CM | POA: Insufficient documentation

## 2021-06-14 DIAGNOSIS — D72819 Decreased white blood cell count, unspecified: Secondary | ICD-10-CM | POA: Insufficient documentation

## 2021-06-14 DIAGNOSIS — Z7189 Other specified counseling: Secondary | ICD-10-CM | POA: Insufficient documentation

## 2021-06-14 DIAGNOSIS — H2513 Age-related nuclear cataract, bilateral: Secondary | ICD-10-CM | POA: Insufficient documentation

## 2021-06-14 DIAGNOSIS — R040 Epistaxis: Secondary | ICD-10-CM | POA: Insufficient documentation

## 2021-06-14 DIAGNOSIS — R609 Edema, unspecified: Secondary | ICD-10-CM | POA: Insufficient documentation

## 2021-06-14 DIAGNOSIS — I872 Venous insufficiency (chronic) (peripheral): Secondary | ICD-10-CM | POA: Insufficient documentation

## 2021-06-14 DIAGNOSIS — G5600 Carpal tunnel syndrome, unspecified upper limb: Secondary | ICD-10-CM | POA: Insufficient documentation

## 2021-06-14 DIAGNOSIS — M5431 Sciatica, right side: Secondary | ICD-10-CM | POA: Insufficient documentation

## 2021-06-14 DIAGNOSIS — J432 Centrilobular emphysema: Secondary | ICD-10-CM | POA: Insufficient documentation

## 2021-06-14 DIAGNOSIS — L723 Sebaceous cyst: Secondary | ICD-10-CM | POA: Insufficient documentation

## 2021-06-14 DIAGNOSIS — L84 Corns and callosities: Secondary | ICD-10-CM | POA: Insufficient documentation

## 2021-06-14 DIAGNOSIS — M545 Low back pain, unspecified: Secondary | ICD-10-CM | POA: Insufficient documentation

## 2021-06-14 DIAGNOSIS — I739 Peripheral vascular disease, unspecified: Secondary | ICD-10-CM | POA: Diagnosis not present

## 2021-06-14 NOTE — Progress Notes (Signed)
Office Note     CC:  follow up Requesting Provider:  Clinic, [facility]  HPI: Jordan Nelson is a 70 y.o. (1951/04/06) male who presents with intermittent medial right lower leg numbness.  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  Upon arising and walking around this resolves.  He denies claudication or rest pain.  He states he has spinal arthritis and has suffered from sciatica in the past.  He is not diabetic and does not smoke.  He was evaluated in January with findings of venous insufficiency.  He uses 15 to compression stockings and has good results.   Past Medical History:  Diagnosis Date   Arthritis    Benign enlargement of prostate    Chronic back pain    Epigastric hernia    GERD (gastroesophageal reflux disease)    Numbness    right hand   Pollen allergy    Scoliosis    " minor"   Tuberculosis    early 1980's   Venous insufficiency (chronic) (peripheral)    Wears glasses     Past Surgical History:  Procedure Laterality Date   EYE SURGERY     HERNIA REPAIR      Social History   Socioeconomic History   Marital status: Single    Spouse name: Not on file   Number of children: Not on file   Years of education: Not on file   Highest education level: Not on file  Occupational History   Occupation: Semi-Retired  Tobacco Use   Smoking status: Former    Types: Cigarettes   Smokeless tobacco: Never  Vaping Use   Vaping Use: Never used  Substance and Sexual Activity   Alcohol use: No   Drug use: No   Sexual activity: Not on file  Other Topics Concern   Not on file  Social History Narrative   Not on file   Social Determinants of Health   Financial Resource Strain: Not on file  Food Insecurity: Not on file  Transportation Needs: Not on file  Physical Activity: Not on file  Stress: Not on file  Social Connections: Not on file  Intimate Partner Violence: Not on file    Family History  Problem Relation Age of Onset   Hypertension Mother     Current Outpatient Medications  Medication Sig Dispense Refill   DULoxetine (CYMBALTA) 30 MG capsule Take 1 capsule by mouth daily.     finasteride (PROSCAR) 5 MG tablet Take 1 tablet by mouth daily.     Ipratropium-Albuterol (COMBIVENT) 20-100 MCG/ACT AERS respimat INHALE 1 PUFF BY MOUTH FOUR TIMES A DAY AS NEEDED - USE CONTINUOUSLY FOR 2 WEEKS THEN AS NEEDED FOR SHORTNESS OF BREATH     lidocaine (LIDODERM) 5 % APPLY 1 PATCH TO SKIN ONCE DAILY (APPLY FOR 12 HOURS, THEN REMOVE FOR 12 HOURS)     meloxicam (MOBIC) 15 MG tablet Take by mouth.     tamsulosin (FLOMAX) 0.4 MG CAPS capsule Take 1 capsule by mouth at bedtime.     Tiotropium Bromide Monohydrate 1.25 MCG/ACT AERS INHALE 2 PUFFS BY MOUTH ONCE A DAY     tiZANidine (ZANAFLEX) 4 MG tablet Take 1-2 tablets (4-8 mg total) by mouth every 6 (six) hours as needed for muscle spasms. 30 tablet 1   No current facility-administered medications for this visit.    No Known Allergies   REVIEW OF SYSTEMS:   [  X] denotes positive finding, [ ]  denotes negative finding Cardiac  Comments:  Chest pain or chest pressure:    Shortness of breath upon exertion:    Short of breath when lying flat:    Irregular heart rhythm:        Vascular    Pain in calf, thigh, or hip brought on by ambulation:    Pain in feet at night that wakes you up from your sleep:     Blood clot in your veins:    Leg swelling:         Pulmonary    Oxygen at home:    Productive cough:     Wheezing:         Neurologic    Sudden weakness in arms or legs:     Sudden numbness in arms or legs:     Sudden onset of difficulty speaking or slurred speech:    Temporary loss of vision in one eye:     Problems with dizziness:         Gastrointestinal    Blood in stool:     Vomited blood:         Genitourinary    Burning when urinating:     Blood in urine:        Psychiatric    Major depression:          Hematologic    Bleeding problems:    Problems with blood clotting too easily:        Skin    Rashes or ulcers:        Constitutional    Fever or chills:      PHYSICAL EXAMINATION:  Vitals:   06/14/21 1130  BP: 124/75  Pulse: 66  Resp: 20  Temp: 97.8 F (36.6 C)  TempSrc: Temporal  SpO2: 100%  Weight: 168 lb 4.8 oz (76.3 kg)  Height: 5\' 11"  (1.803 m)    General:  WDWN in NAD; vital signs documented above Gait: Not observed HENT: WNL, normocephalic Pulmonary: normal non-labored breathing , without Rales, rhonchi,  wheezing Cardiac: regular HR, without  Murmurs without carotid bruit Abdomen: soft, NT, no masses Skin: without rashes Vascular Exam/Pulses: 3+ bilateral dorsalis pedis, posterior tibial and radial pulses. Extremities: without ischemic changes, without Gangrene , without cellulitis; without open wounds;  Musculoskeletal: no muscle wasting or atrophy  Neurologic: A&O X 3;  No focal weakness or paresthesias are detected Psychiatric:  The pt has Normal affect.   Non-Invasive Vascular Imaging:   06/14/2021 ABI/TBIToday's ABIToday's TBIPrevious ABIPrevious TBI  +-------+-----------+-----------+------------+------------+  Right  1.10       0.80                                 +-------+-----------+-----------+------------+------------+  Left   1.11       0.83                                 +-------+-----------+-----------+------------+------------+      Summary:  Right: Resting right ankle-brachial index is within normal range. No  evidence of significant right lower extremity arterial disease. The right  toe-brachial index is normal.   > 10 mm Hg difference in brachial systolic pressures.  Left: Resting left ankle-brachial index is within normal range. No  evidence of significant left lower extremity arterial disease. The left  toe-brachial index is normal.     *  See table(s) above for measurements and observations.      Preliminary    ASSESSMENT/PLAN:: 70 y.o. male here for evaluation of intermittent right lower leg numbness.  No evidence of arterial insufficiency.  Strong palpable pulses. Prior diagnosis of venous insufficiency.  Continue compression stockings, elevation.  We discussed routine exercise.  Follow-up as needed   Milinda Antis, [name] [facility] [phone]  Clinic MD:   Dr. Randie Heinz

## 2021-07-12 ENCOUNTER — Other Ambulatory Visit: Payer: Self-pay

## 2021-07-12 ENCOUNTER — Telehealth: Payer: Self-pay

## 2021-07-12 DIAGNOSIS — R600 Localized edema: Secondary | ICD-10-CM

## 2021-07-12 NOTE — Telephone Encounter (Signed)
Patient calls today to report swelling in his left ankle. Says it is usually the right that causes him more trouble but the left has been swollen and somewhat painful for 3 days. Denies any open wounds or foot pain. He wonders why the pain switches back and forth between ankles. Patient says he is still trying to elevate and wear compression socks. Offered reassurance that he was doing what is recommended and offered appt w/lle reflux at the end of December. Patient is very concerned about his ankles and "wants to find out what's going on." Patient will come in for study tomorrow and follow up with PA on 12/14.

## 2021-07-13 ENCOUNTER — Other Ambulatory Visit: Payer: Self-pay

## 2021-07-13 ENCOUNTER — Ambulatory Visit (HOSPITAL_COMMUNITY)
Admission: RE | Admit: 2021-07-13 | Discharge: 2021-07-13 | Disposition: A | Discharge status: Home / Self Care | Payer: No Typology Code available for payment source | Source: Ambulatory Visit | Attending: Vascular Surgery | Admitting: Vascular Surgery

## 2021-07-13 DIAGNOSIS — R600 Localized edema: Secondary | ICD-10-CM | POA: Diagnosis not present

## 2021-07-16 ENCOUNTER — Ambulatory Visit (HOSPITAL_COMMUNITY)
Admission: EM | Admit: 2021-07-16 | Discharge: 2021-07-16 | Disposition: A | Discharge status: Home / Self Care | Payer: Medicare HMO | Attending: Internal Medicine | Admitting: Internal Medicine

## 2021-07-16 ENCOUNTER — Encounter (HOSPITAL_COMMUNITY): Payer: Self-pay | Admitting: Emergency Medicine

## 2021-07-16 ENCOUNTER — Other Ambulatory Visit: Payer: Self-pay

## 2021-07-16 DIAGNOSIS — M7662 Achilles tendinitis, left leg: Secondary | ICD-10-CM

## 2021-07-16 NOTE — ED Triage Notes (Signed)
Pt c/o left foot swollen for over a week. Has PVD but typically right foot swollen vs left. Denies any injuries.

## 2021-07-16 NOTE — Discharge Instructions (Signed)
Gentle stretching exercises before and after workouts Continue NSAIDs Heating pad use will help with discomfort/pain Return to urgent care if symptoms worsen.

## 2021-07-16 NOTE — ED Provider Notes (Signed)
[facility]    CSN: [id number] Arrival date & time: 07/16/21  1901      History   Chief Complaint Chief Complaint  Patient presents with   Foot Swelling    HPI Jordan Nelson is a 70 y.o. male comes to the urgent care with painful swelling over the left ankle.  Symptoms started 1 week ago and has been persistent.  Pain is throbbing, constant, aggravated by palpation and denies any relieving factors.  Associated with swelling over the Achilles tendon.  No significant erythema.  No open wounds or discharges.  Patient denies trauma to the leg.  Patient exercises on a regular basis but denies any stretching before or after workout.  No calf pain.  No calf swelling.  No cough tightness.  No recent long distance travel.   HPI  Past Medical History:  Diagnosis Date   Arthritis    Benign enlargement of prostate    Chronic back pain    Epigastric hernia    GERD (gastroesophageal reflux disease)    Numbness    right hand   Pollen allergy    Scoliosis    " minor"   Tuberculosis    early 1980's   Venous insufficiency (chronic) (peripheral)    Wears glasses     Patient Active Problem List   Diagnosis Date Noted   Anemia, unspecified 06/14/2021   Corn of foot 06/14/2021   Carpal tunnel syndrome 06/14/2021   Epistaxis 06/14/2021   Right sided sciatica 06/14/2021   Age-related nuclear cataract, bilateral 06/14/2021   Benign prostatic hyperplasia with lower urinary tract symptoms 06/14/2021   Centrilobular emphysema (HCC) 06/14/2021   Decreased white blood cell count, unspecified 06/14/2021   Edema, unspecified 06/14/2021   Low back pain, unspecified 06/14/2021   Other specified counseling 06/14/2021   Sebaceous cyst 06/14/2021   Varicose veins of unspecified lower extremity with inflammation 06/14/2021   Venous insufficiency (chronic) (peripheral) 06/14/2021   Elevated blood-pressure reading, without diagnosis of hypertension 10/17/2020   Tinea pedis, recurrent  12/24/2019   Onychomycosis of toenail 12/24/2019   Scoliosis of lumbar spine 12/24/2019   Degenerative joint disease (DJD) of lumbar spine 12/24/2019    Past Surgical History:  Procedure Laterality Date   EYE SURGERY     HERNIA REPAIR         Home Medications    Prior to Admission medications   Medication Sig Start Date End Date Taking? Authorizing Provider  DULoxetine (CYMBALTA) 30 MG capsule Take 1 capsule by mouth daily. 10/27/20   [provider]  finasteride (PROSCAR) 5 MG tablet Take 1 tablet by mouth daily. 07/25/20   [provider]  Ipratropium-Albuterol (COMBIVENT) 20-100 MCG/ACT AERS respimat INHALE 1 PUFF BY MOUTH FOUR TIMES A DAY AS NEEDED - USE CONTINUOUSLY FOR 2 WEEKS THEN AS NEEDED FOR SHORTNESS OF BREATH 07/25/20   [provider]  lidocaine (LIDODERM) 5 % APPLY 1 PATCH TO SKIN ONCE DAILY (APPLY FOR 12 HOURS, THEN REMOVE FOR 12 HOURS) 09/14/20   [provider]  meloxicam (MOBIC) 15 MG tablet Take by mouth. 01/03/20   [provider]  tamsulosin (FLOMAX) 0.4 MG CAPS capsule Take 1 capsule by mouth at bedtime. 01/16/21   [provider]  Tiotropium Bromide Monohydrate 1.25 MCG/ACT AERS INHALE 2 PUFFS BY MOUTH ONCE A DAY 09/24/20   [provider]  tiZANidine (ZANAFLEX) 4 MG tablet Take 1-2 tablets (4-8 mg total) by mouth every 6 (six) hours as needed for muscle spasms.  12/24/19   Raylene Everts, MD    Family History Family History  Problem Relation Age of Onset   Hypertension Mother     Social History Social History   Tobacco Use   Smoking status: Former    Types: Cigarettes   Smokeless tobacco: Never  Vaping Use   Vaping Use: Never used  Substance Use Topics   Alcohol use: No   Drug use: No     Allergies   Patient has no known allergies.   Review of Systems Review of Systems  Constitutional: Negative.   Gastrointestinal: Negative.   Musculoskeletal:  Positive for arthralgias and  myalgias. Negative for gait problem, neck pain and neck stiffness.    Physical Exam Triage Vital Signs ED Triage Vitals  Enc Vitals Group     BP 07/16/21 1955 138/86     Pulse Rate 07/16/21 1955 83     Resp 07/16/21 1955 19     Temp 07/16/21 1955 98.6 F (37 C)     Temp Source 07/16/21 1955 Oral     SpO2 07/16/21 1955 98 %     Weight --      Height --      Head Circumference --      Peak Flow --      Pain Score 07/16/21 1953 3     Pain Loc --      Pain Edu? --      Excl. in [place]? --    No data found.  Updated Vital Signs BP 138/86 (BP Location: Right Arm)   Pulse 83   Temp 98.6 F (37 C) (Oral)   Resp 19   SpO2 98%   Visual Acuity Right Eye Distance:   Left Eye Distance:   Bilateral Distance:    Right Eye Near:   Left Eye Near:    Bilateral Near:     Physical Exam Vitals and nursing note reviewed.  Constitutional:      General: He is not in acute distress.    Appearance: Normal appearance. He is not ill-appearing.  Cardiovascular:     Rate and Rhythm: Normal rate and regular rhythm.  Pulmonary:     Effort: Pulmonary effort is normal.     Breath sounds: Normal breath sounds.  Abdominal:     General: Bowel sounds are normal.     Palpations: Abdomen is soft.  Musculoskeletal:        General: Swelling and tenderness present. No deformity or signs of injury. Normal range of motion.  Skin:    General: Skin is warm.  Neurological:     Mental Status: He is alert.     UC Treatments / Results  Labs (all labs ordered are listed, but only abnormal results are displayed) Labs Reviewed - No data to display  EKG   Radiology No results found.  Procedures Procedures (including critical care time)  Medications Ordered in UC Medications - No data to display  Initial Impression / Assessment and Plan / UC Course  I have reviewed the triage vital signs and the nursing notes.  Pertinent labs & imaging results that were available during my care of the patient  were reviewed by me and considered in my medical decision making (see chart for details).     1.  Left Achilles tendinitis: Continue meloxicam Heating pad use Gentle stretches Range of motion exercises Return to urgent care if symptoms worsen. Final Clinical Impressions(s) / UC Diagnoses   Final diagnoses:  Left Achilles tendinitis  Discharge Instructions      Gentle stretching exercises before and after workouts Continue NSAIDs Heating pad use will help with discomfort/pain Return to urgent care if symptoms worsen.     ED Prescriptions   None    PDMP not reviewed this encounter.   Chase Picket, MD 07/19/21 1106

## 2021-07-25 ENCOUNTER — Encounter: Payer: Self-pay | Admitting: Physician Assistant

## 2021-07-25 ENCOUNTER — Other Ambulatory Visit: Payer: Self-pay

## 2021-07-25 ENCOUNTER — Ambulatory Visit (INDEPENDENT_AMBULATORY_CARE_PROVIDER_SITE_OTHER): Payer: No Typology Code available for payment source | Admitting: Physician Assistant

## 2021-07-25 VITALS — BP 113/69 | HR 80 | Temp 98.1°F | Resp 16 | Ht 71.0 in | Wt 165.0 lb

## 2021-07-25 DIAGNOSIS — M7662 Achilles tendinitis, left leg: Secondary | ICD-10-CM

## 2021-07-25 NOTE — Progress Notes (Signed)
[facility] [place]   Reason for referral: Swollen left ankle  History of Present Illness  Jordan Nelson is a 70 y.o. male who presents with chief complaint: swollen left ankle over a 5 day history.  He was seen at urgent care 07/16/21.  He was diagnosed achillis tendonitis.    He was given NSAIDS and stretches.  He states this has helped and he has no more ankle edema.  He did have a venous reflux study on the left LE on 07/13/21 to r/o venous reflux.    He is active and has minimal reflux on the right from a previous visit.  He wears 15-20 mm hg compression knee highs daily and elevates his legs.    Past Medical History:  Diagnosis Date   Arthritis    Benign enlargement of prostate    Chronic back pain    Epigastric hernia    GERD (gastroesophageal reflux disease)    Numbness    right hand   Pollen allergy    Scoliosis    " minor"   Tuberculosis    early 1980's   Venous insufficiency (chronic) (peripheral)    Wears glasses     Past Surgical History:  Procedure Laterality Date   EYE SURGERY     HERNIA REPAIR      Social History   Socioeconomic History   Marital status: Single    Spouse name: Not on file   Number of children: Not on file   Years of education: Not on file   Highest education level: Not on file  Occupational History   Occupation: Semi-Retired  Tobacco Use   Smoking status: Former    Types: Cigarettes   Smokeless tobacco: Never  Vaping Use   Vaping Use: Never used  Substance and Sexual Activity   Alcohol use: No   Drug use: No   Sexual activity: Not on file  Other Topics Concern   Not on file  Social History Narrative   Not on file   Social Determinants of Health   Financial Resource Strain: Not on file  Food Insecurity: Not on file  Transportation Needs: Not on file  Physical Activity: Not on file  Stress: Not on file  Social Connections: Not on file  Intimate Partner Violence: Not on file    Family History   Problem Relation Age of Onset   Hypertension Mother     Current Outpatient Medications on File Prior to Visit  Medication Sig Dispense Refill   DULoxetine (CYMBALTA) 30 MG capsule Take 1 capsule by mouth daily.     finasteride (PROSCAR) 5 MG tablet Take 1 tablet by mouth daily.     Ipratropium-Albuterol (COMBIVENT) 20-100 MCG/ACT AERS respimat INHALE 1 PUFF BY MOUTH FOUR TIMES A DAY AS NEEDED - USE CONTINUOUSLY FOR 2 WEEKS THEN AS NEEDED FOR SHORTNESS OF BREATH     lidocaine (LIDODERM) 5 % APPLY 1 PATCH TO SKIN ONCE DAILY (APPLY FOR 12 HOURS, THEN REMOVE FOR 12 HOURS)     meloxicam (MOBIC) 15 MG tablet Take by mouth.     tamsulosin (FLOMAX) 0.4 MG CAPS capsule Take 1 capsule by mouth at bedtime.     Tiotropium Bromide Monohydrate 1.25 MCG/ACT AERS INHALE 2 PUFFS BY MOUTH ONCE A DAY     tiZANidine (ZANAFLEX) 4 MG tablet Take 1-2 tablets (4-8 mg total) by mouth every 6 (six) hours as needed for muscle spasms. 30 tablet 1   No current facility-administered medications on file  prior to visit.    Allergies as of 07/25/2021   (No Known Allergies)     ROS:   General:  No weight loss, Fever, chills  HEENT: No recent headaches, no nasal bleeding, no visual changes, no sore throat  Neurologic: No dizziness, blackouts, seizures. No recent symptoms of stroke or mini- stroke. No recent episodes of slurred speech, or temporary blindness.  Cardiac: No recent episodes of chest pain/pressure, no shortness of breath at rest.  No shortness of breath with exertion.  Denies history of atrial fibrillation or irregular heartbeat  Vascular: No history of rest pain in feet.  No history of claudication.  No history of non-healing ulcer, No history of DVT   Pulmonary: No home oxygen, no productive cough, no hemoptysis,  No asthma or wheezing  Musculoskeletal:  [ ]  Arthritis, [x ] Low back pain,  [ x] Joint pain  Hematologic:No history of hypercoagulable state.  No history of easy bleeding.  No history  of anemia  Gastrointestinal: No hematochezia or melena,  No gastroesophageal reflux, no trouble swallowing  Urinary: [ ]  chronic Kidney disease, [ ]  on HD - [ ]  MWF or [ ]  TTHS, [ ]  Burning with urination, [ ]  Frequent urination, [ ]  Difficulty urinating;   Skin: No rashes  Psychological: No history of anxiety,  No history of depression  Physical Examination  Vitals:   07/25/21 1509  BP: 113/69  Pulse: 80  Resp: 16  Temp: 98.1 F (36.7 C)  TempSrc: Temporal  SpO2: 100%  Weight: 165 lb (74.8 kg)  Height: 5\' 11"  (1.803 m)    Body mass index is 23.01 kg/m.  General:  Alert and oriented, no acute distress HEENT: Normal Neck: No bruit or JVD Pulmonary: Clear to auscultation bilaterally Cardiac: Regular Rate and Rhythm without murmur Abdomen: Soft, non-tender, non-distended, no mass, no scars Skin: No rash Extremity Pulses:  2+ radial, brachial, femoral, dorsalis pedis pulses bilaterally Musculoskeletal: No deformity or edema, no achillis defect palpated Neurologic: Upper and lower extremity motor 5/5 and symmetric  DATA: +--------------+---------+------+-----------+------------+--------+   LEFT           Reflux No Reflux Reflux Time Diameter cms Comments                              Yes                                       +--------------+---------+------+-----------+------------+--------+   CFV                       yes    >1 second                          +--------------+---------+------+-----------+------------+--------+   FV mid         no                                                   +--------------+---------+------+-----------+------------+--------+   Popliteal      no                                                   +--------------+---------+------+-----------+------------+--------+  GSV at [facility]     no                               0.30                +--------------+---------+------+-----------+------------+--------+   GSV prox thigh no                                0.23                +--------------+---------+------+-----------+------------+--------+   GSV mid thigh  no                               0.23                +--------------+---------+------+-----------+------------+--------+   GSV dist thigh no                               0.14                +--------------+---------+------+-----------+------------+--------+   GSV at knee    no                               0.17                +--------------+---------+------+-----------+------------+--------+   GSV prox calf  no                               0.19                +--------------+---------+------+-----------+------------+--------+   GSV mid calf   no                               0.22                +--------------+---------+------+-----------+------------+--------+   SSV Pop Fossa  no                               0.19                +--------------+---------+------+-----------+------------+--------+   SSV prox calf  no                               0.25                +--------------+---------+------+-----------+------------+--------+   SSV mid calf   no                               0.27                +--------------+---------+------+-----------+------------+--------+   AASV                                            0.20                +--------------+---------+------+-----------+------------+--------+  Summary:  Left:  - No evidence of deep vein thrombosis seen in the left lower extremity,  from the common femoral through the popliteal veins.  - No evidence of superficial venous reflux seen in the left greater  saphenous vein.  - No evidence of superficial venous reflux seen in the left short  saphenous vein.  - Venous reflux is noted in the left common femoral vein.   - Enlarged lymph nodes visualized in left groin.     Assessment/Plan: Left LE tendonitis Left CFV reflux without SFJ reflux or GSV reflux.  The veins are smaller than 0.4 cm throughout its  course.   No indication for further work-up and patient would not be a candidate for laser ablation therapy at this time. I demonstrated proper elevation and achillis stretches.  Continue current active lifestyle as tolerates.  Wear compression daily and possibly take up Yoga for flexibility.     Mosetta Pigeon [name] [facility] [place] Office: [phone]  MD in clinic [place]

## 2021-10-03 ENCOUNTER — Ambulatory Visit (HOSPITAL_COMMUNITY)
Admission: EM | Admit: 2021-10-03 | Discharge: 2021-10-03 | Disposition: A | Discharge status: Home / Self Care | Payer: No Typology Code available for payment source | Attending: Family Medicine | Admitting: Family Medicine

## 2021-10-03 ENCOUNTER — Encounter (HOSPITAL_COMMUNITY): Payer: Self-pay | Admitting: Emergency Medicine

## 2021-10-03 DIAGNOSIS — M7662 Achilles tendinitis, left leg: Secondary | ICD-10-CM

## 2021-10-03 MED ORDER — PREDNISONE 20 MG PO TABS: 40.0000 mg | ORAL_TABLET | Freq: Every day | ORAL | 0 refills | Status: AC

## 2021-10-03 NOTE — ED Triage Notes (Signed)
Pt c/o left achilles pain for a couple days. Been putting ice on area. Pain was worse last night.

## 2021-10-03 NOTE — Discharge Instructions (Addendum)
Take prednisone 20 mg, 2 daily for 3 days.  On those days do not take the meloxicam.  Keep your follow-up appointment with the podiatrist.  If you are not improving he may want to send you to physical therapy

## 2021-10-03 NOTE — ED Provider Notes (Signed)
[facility]    CSN: [id number] Arrival date & time: 10/03/21  1918      History   Chief Complaint Chief Complaint  Patient presents with   Foot Pain    HPI SHABAKA FLATTERY is a 71 y.o. male.    Foot Pain  Here for renewed left heel and ankle pain.  He had been diagnosed with Achilles tendinitis about 2 months ago, and it had improved.  He has still been taking meloxicam.  He began doing jumping jacks a couple of weeks ago and he feels that that is irritated his left heel and ankle again.  He has not fallen or had any trauma.  Past Medical History:  Diagnosis Date   Arthritis    Benign enlargement of prostate    Chronic back pain    Epigastric hernia    GERD (gastroesophageal reflux disease)    Numbness    right hand   Pollen allergy    Scoliosis    " minor"   Tuberculosis    early 1980's   Venous insufficiency (chronic) (peripheral)    Wears glasses     Patient Active Problem List   Diagnosis Date Noted   Anemia, unspecified 06/14/2021   Corn of foot 06/14/2021   Carpal tunnel syndrome 06/14/2021   Epistaxis 06/14/2021   Right sided sciatica 06/14/2021   Age-related nuclear cataract, bilateral 06/14/2021   Benign prostatic hyperplasia with lower urinary tract symptoms 06/14/2021   Centrilobular emphysema ([place]) 06/14/2021   Decreased white blood cell count, unspecified 06/14/2021   Edema, unspecified 06/14/2021   Low back pain, unspecified 06/14/2021   Other specified counseling 06/14/2021   Sebaceous cyst 06/14/2021   Varicose veins of unspecified lower extremity with inflammation 06/14/2021   Venous insufficiency (chronic) (peripheral) 06/14/2021   Elevated blood-pressure reading, without diagnosis of hypertension 10/17/2020   Tinea pedis, recurrent 12/24/2019   Onychomycosis of toenail 12/24/2019   Scoliosis of lumbar spine 12/24/2019   Degenerative joint disease (DJD) of lumbar spine 12/24/2019    Past Surgical History:  Procedure  Laterality Date   EYE SURGERY     HERNIA REPAIR         Home Medications    Prior to Admission medications   Medication Sig Start Date End Date Taking? Authorizing Provider  predniSONE (DELTASONE) 20 MG tablet Take 2 tablets (40 mg total) by mouth daily with breakfast for 3 days. 10/03/21 10/06/21 Yes Zari Cly, Gwenlyn Perking, MD  DULoxetine (CYMBALTA) 30 MG capsule Take 1 capsule by mouth daily. 10/27/20   [provider]  finasteride (PROSCAR) 5 MG tablet Take 1 tablet by mouth daily. 07/25/20   [provider]  Ipratropium-Albuterol (COMBIVENT) 20-100 MCG/ACT AERS respimat INHALE 1 PUFF BY MOUTH FOUR TIMES A DAY AS NEEDED - USE CONTINUOUSLY FOR 2 WEEKS THEN AS NEEDED FOR SHORTNESS OF BREATH 07/25/20   [provider]  lidocaine (LIDODERM) 5 % APPLY 1 PATCH TO SKIN ONCE DAILY (APPLY FOR 12 HOURS, THEN REMOVE FOR 12 HOURS) 09/14/20   [provider]  meloxicam (MOBIC) 15 MG tablet Take by mouth. 01/03/20   [provider]  tamsulosin (FLOMAX) 0.4 MG CAPS capsule Take 1 capsule by mouth at bedtime. 01/16/21   [provider]  Tiotropium Bromide Monohydrate 1.25 MCG/ACT AERS INHALE 2 PUFFS BY MOUTH ONCE A DAY 09/24/20   [provider]  tiZANidine (ZANAFLEX) 4 MG tablet Take 1-2 tablets (4-8 mg total) by mouth every 6 (six) hours as needed for muscle  spasms. 12/24/19   Raylene Everts, MD    Family History Family History  Problem Relation Age of Onset   Hypertension Mother     Social History Social History   Tobacco Use   Smoking status: Former    Types: Cigarettes   Smokeless tobacco: Never  Vaping Use   Vaping Use: Never used  Substance Use Topics   Alcohol use: No   Drug use: No     Allergies   Patient has no known allergies.   Review of Systems Review of Systems   Physical Exam Triage Vital Signs ED Triage Vitals  Enc Vitals Group     BP 10/03/21 1945 126/73     Pulse Rate 10/03/21 1945 67     Resp 10/03/21  1945 18     Temp 10/03/21 1945 98.5 F (36.9 C)     Temp Source 10/03/21 1945 Oral     SpO2 10/03/21 1945 100 %     Weight --      Height --      Head Circumference --      Peak Flow --      Pain Score 10/03/21 1943 4     Pain Loc --      Pain Edu? --      Excl. in [place]? --    No data found.  Updated Vital Signs BP 126/73 (BP Location: Left Arm)    Pulse 67    Temp 98.5 F (36.9 C) (Oral)    Resp 18    SpO2 100%   Visual Acuity Right Eye Distance:   Left Eye Distance:   Bilateral Distance:    Right Eye Near:   Left Eye Near:    Bilateral Near:     Physical Exam Vitals reviewed.  Constitutional:      Appearance: He is not toxic-appearing.  Cardiovascular:     Rate and Rhythm: Normal rate and regular rhythm.  Musculoskeletal:     Comments: Tenderness of the lateral left ankle and of the Achilles.  Minimal edema laterally.  No erythema or ulceration  Skin:    Capillary Refill: Capillary refill takes less than 2 seconds.     Coloration: Skin is not jaundiced or pale.  Neurological:     General: No focal deficit present.     Mental Status: He is alert and oriented to person, place, and time.  Psychiatric:        Behavior: Behavior normal.     UC Treatments / Results  Labs (all labs ordered are listed, but only abnormal results are displayed) Labs Reviewed - No data to display  EKG   Radiology No results found.  Procedures Procedures (including critical care time)  Medications Ordered in UC Medications - No data to display  Initial Impression / Assessment and Plan / UC Course  I have reviewed the triage vital signs and the nursing notes.  Pertinent labs & imaging results that were available during my care of the patient were reviewed by me and considered in my medical decision making (see chart for details).     We will do prednisone for 3 days, and he will not take the meloxicam then he is already icing it and we discussed stretches.  He will will see  podiatry soon; we discussed possible physical therapy as a benefit for him Final Clinical Impressions(s) / UC Diagnoses   Final diagnoses:  Achilles tendinitis of left lower extremity     Discharge Instructions  Take prednisone 20 mg, 2 daily for 3 days.  On those days do not take the meloxicam.  Keep your follow-up appointment with the podiatrist.  If you are not improving he may want to send you to physical therapy     ED Prescriptions     Medication Sig Dispense Auth. Provider   predniSONE (DELTASONE) 20 MG tablet Take 2 tablets (40 mg total) by mouth daily with breakfast for 3 days. 6 tablet Windy Carina Gwenlyn Perking, MD      PDMP not reviewed this encounter.   Barrett Henle, MD 10/03/21 2040

## 2021-10-12 ENCOUNTER — Other Ambulatory Visit: Payer: Self-pay | Admitting: Neurosurgery

## 2021-10-12 DIAGNOSIS — M4150 Other secondary scoliosis, site unspecified: Secondary | ICD-10-CM

## 2021-11-03 ENCOUNTER — Ambulatory Visit
Admission: RE | Admit: 2021-11-03 | Discharge: 2021-11-03 | Disposition: A | Discharge status: Home / Self Care | Payer: No Typology Code available for payment source | Source: Ambulatory Visit | Attending: Neurosurgery | Admitting: Neurosurgery

## 2021-11-03 ENCOUNTER — Other Ambulatory Visit: Payer: Self-pay

## 2021-11-03 DIAGNOSIS — M4150 Other secondary scoliosis, site unspecified: Secondary | ICD-10-CM

## 2021-12-30 ENCOUNTER — Other Ambulatory Visit: Payer: Self-pay

## 2021-12-30 ENCOUNTER — Emergency Department (HOSPITAL_COMMUNITY)
Admission: EM | Admit: 2021-12-30 | Discharge: 2021-12-30 | Disposition: A | Discharge status: Home / Self Care | Payer: No Typology Code available for payment source | Attending: Emergency Medicine | Admitting: Emergency Medicine

## 2021-12-30 ENCOUNTER — Encounter (HOSPITAL_COMMUNITY): Payer: Self-pay | Admitting: Emergency Medicine

## 2021-12-30 DIAGNOSIS — R600 Localized edema: Secondary | ICD-10-CM | POA: Diagnosis not present

## 2021-12-30 DIAGNOSIS — M7989 Other specified soft tissue disorders: Secondary | ICD-10-CM | POA: Diagnosis present

## 2021-12-30 NOTE — ED Provider Notes (Signed)
[facility] EMERGENCY DEPARTMENT Provider Note   CSN: [id number] Arrival date & time: 12/30/21  2013     History  No chief complaint on file.   Jordan Nelson is a 71 y.o. male with history of venous insufficiency, emphysema presented to the ED with concerns for right lower extremity swelling.  Patient states that 2 days ago he noted swelling in his right lower extremity.  He did not have any associated pain.  He elevated the leg overnight and the next morning states that the swelling had greatly improved.  He still thinks that the right leg is slightly more swollen than the left leg.  He is also requesting a referral to a vascular surgeon for varicose veins.  He denies any fevers, rash, calf pain.  No history of blood clots and he is not currently on blood thinners.  HPI     Home Medications Prior to Admission medications   Medication Sig Start Date End Date Taking? Authorizing Provider  DULoxetine (CYMBALTA) 30 MG capsule Take 1 capsule by mouth daily. 10/27/20   [provider]  finasteride (PROSCAR) 5 MG tablet Take 1 tablet by mouth daily. 07/25/20   [provider]  Ipratropium-Albuterol (COMBIVENT) 20-100 MCG/ACT AERS respimat INHALE 1 PUFF BY MOUTH FOUR TIMES A DAY AS NEEDED - USE CONTINUOUSLY FOR 2 WEEKS THEN AS NEEDED FOR SHORTNESS OF BREATH 07/25/20   [provider]  lidocaine (LIDODERM) 5 % APPLY 1 PATCH TO SKIN ONCE DAILY (APPLY FOR 12 HOURS, THEN REMOVE FOR 12 HOURS) 09/14/20   [provider]  meloxicam (MOBIC) 15 MG tablet Take by mouth. 01/03/20   [provider]  tamsulosin (FLOMAX) 0.4 MG CAPS capsule Take 1 capsule by mouth at bedtime. 01/16/21   [provider]  Tiotropium Bromide Monohydrate 1.25 MCG/ACT AERS INHALE 2 PUFFS BY MOUTH ONCE A DAY 09/24/20   [provider]  tiZANidine (ZANAFLEX) 4 MG tablet Take 1-2 tablets (4-8 mg total) by mouth every 6 (six) hours as needed for muscle  spasms. 12/24/19   Raylene Everts, MD      Allergies    Patient has no known allergies.    Review of Systems   Review of Systems  Constitutional:  Negative for fever.  Respiratory:  Negative for shortness of breath.   Cardiovascular:  Positive for leg swelling. Negative for chest pain.  Skin:  Negative for rash.   Physical Exam Updated Vital Signs BP (!) 137/100 (BP Location: Right Arm)   Pulse (!) 55   Temp 98.9 F (37.2 C) (Oral)   Resp 18   Wt 76.2 kg   SpO2 100%   BMI 23.43 kg/m  Physical Exam Constitutional:      General: He is not in acute distress.    Appearance: He is not toxic-appearing or diaphoretic.  HENT:     Head: Normocephalic.  Cardiovascular:     Rate and Rhythm: Normal rate and regular rhythm.  Pulmonary:     Effort: Pulmonary effort is normal. No respiratory distress.  Musculoskeletal:        General: No deformity.     Cervical back: Neck supple.     Right lower leg: Edema (1+) present.     Left lower leg: Edema (1+) present.  Skin:    General: Skin is warm and dry.     Findings: No erythema or rash.  Neurological:     General: No focal deficit present.     Mental Status:  He is alert and oriented to person, place, and time.    ED Results / Procedures / Treatments   Labs (all labs ordered are listed, but only abnormal results are displayed) Labs Reviewed - No data to display  EKG None  Radiology No results found.  Procedures Procedures    Medications Ordered in ED Medications - No data to display  ED Course/ Medical Decision Making/ A&P Clinical Course as of 12/30/21 2354  Sun Dec 30, 2021  2342 70-year male presented emergency department with complaint of intermittent leg swelling, right more than the left, felt that his right calf was swollen and tender yesterday.  He denies history of DVT or PE.  The swelling appears to have gotten better.  Discussed with the patient returning for vascular ultrasound in the morning during  operating hours for ultrasound.  We discussed the pros and cons of prophylactic Lovenox and blood thinners overnight, he prefer not to have this at this time.  I think this is reasonable.  He has no signs or symptoms of PE and otherwise is well-appearing.  He asked for an office number for the vascular vein specialist for varicose veins, this number was provided. [MT]    Clinical Course User Index [MT] Wyvonnia Dusky, MD                           Medical Decision Making  71 year old male with a history of venous insufficiency and emphysema presenting to the ED with right lower extremity swelling.  On exam, the patient is afebrile and hemodynamically stable.  He does have 1+ edema of the bilateral lower extremities but does appear equal.  No overlying erythema or warmth.  No concern for cellulitis.  His swelling appears to be most consistent with his known venous insufficiency.  However, given that he did note increased welling over the right lower extremity, cannot rule out DVT.  I have recommended that the patient return tomorrow for an ultrasound of the right lower extremity to rule out DVT.  Also offered a dose of Lovenox but he declines.  Overall I have a lower suspicion for DVT given that he has swelling in the bilateral lower extremities that is largely equal.  Patient was also given the the phone number for Dr. Stanford Breed with vascular surgery for follow-up of his varicose veins and venous insufficiency.  Strict return precautions were discussed and the patient was discharged home in stable condition.        Final Clinical Impression(s) / ED Diagnoses Final diagnoses:  Lower extremity edema    Rx / DC Orders ED Discharge Orders          Ordered    LE VENOUS        12/30/21 2259              Sondra Come, MD 12/30/21 2354    Wyvonnia Dusky, MD 12/30/21 2359

## 2021-12-30 NOTE — ED Notes (Signed)
Patient verbalizes understanding of discharge instructions. Opportunity for questioning and answers were provided. Armband removed by staff, pt discharged from ED to home via POV  

## 2021-12-30 NOTE — ED Triage Notes (Signed)
Patient with h/o PVD presents for intermittent R calf swelling since Friday. No redness noted. Minimally swollen in exam (pt states looks much better today). No pain. No wounds to legs or feet.  Denies SOB, CP. No h/o DVTs. Does not take a blood thinner.

## 2021-12-30 NOTE — Discharge Instructions (Signed)
Continue to wear the compression stockings to help with the swelling in your legs. A ultrasound for your right leg has been requested.  Please return to [facility] tomorrow to obtain the ultrasound to evaluate for a possible blood clot in your leg.  Please return to the emergency department if you develop fevers, worsening swelling, pain, or any other concerning symptoms.

## 2021-12-31 ENCOUNTER — Ambulatory Visit (HOSPITAL_COMMUNITY)
Admission: RE | Admit: 2021-12-31 | Discharge: 2021-12-31 | Disposition: A | Discharge status: Home / Self Care | Payer: Medicare HMO | Source: Ambulatory Visit | Attending: Emergency Medicine | Admitting: Emergency Medicine

## 2021-12-31 DIAGNOSIS — M7989 Other specified soft tissue disorders: Secondary | ICD-10-CM | POA: Insufficient documentation

## 2022-01-08 ENCOUNTER — Other Ambulatory Visit: Payer: Self-pay | Admitting: *Deleted

## 2022-01-08 ENCOUNTER — Telehealth: Payer: Self-pay | Admitting: *Deleted

## 2022-01-08 DIAGNOSIS — I83891 Varicose veins of right lower extremities with other complications: Secondary | ICD-10-CM

## 2022-01-08 NOTE — Telephone Encounter (Signed)
Returning Mr. Jordan Nelson's earlier call regarding compression hose and episode 4 weeks ago of varicose bleeding (top of right foot).  Mr. Jordan Nelson states he was able to stop bleeding from right foot varicosity with direct pressure.  Advised him if bleeding reoccurred, to elevate his leg above level of his heart and to use direct compression for 10-15 minutes or until bleeding stops. Mr. Jordan Nelson seen in ER on 12-30-2021 for right leg swelling. Venous duplex of right leg was negative for DVT on 12-31-2021. Mr.Jordan Nelson will come into [facility] to be measured for compression hose.  Notified [facility] scheduling to make [facility] follow up appointment with venous reflux study (right leg, order in Epic) and to notify Mr. Jordan Nelson.

## 2022-02-23 ENCOUNTER — Encounter (HOSPITAL_COMMUNITY): Payer: Self-pay | Admitting: Emergency Medicine

## 2022-02-23 ENCOUNTER — Emergency Department (HOSPITAL_COMMUNITY)
Admission: EM | Admit: 2022-02-23 | Discharge: 2022-02-24 | Disposition: A | Discharge status: Home / Self Care | Payer: No Typology Code available for payment source | Attending: Emergency Medicine | Admitting: Emergency Medicine

## 2022-02-23 DIAGNOSIS — R7401 Elevation of levels of liver transaminase levels: Secondary | ICD-10-CM | POA: Insufficient documentation

## 2022-02-23 DIAGNOSIS — Z87891 Personal history of nicotine dependence: Secondary | ICD-10-CM | POA: Insufficient documentation

## 2022-02-23 DIAGNOSIS — D72819 Decreased white blood cell count, unspecified: Secondary | ICD-10-CM | POA: Insufficient documentation

## 2022-02-23 DIAGNOSIS — R109 Unspecified abdominal pain: Secondary | ICD-10-CM | POA: Diagnosis present

## 2022-02-23 DIAGNOSIS — R634 Abnormal weight loss: Secondary | ICD-10-CM | POA: Insufficient documentation

## 2022-02-23 DIAGNOSIS — G8929 Other chronic pain: Secondary | ICD-10-CM | POA: Diagnosis not present

## 2022-02-23 LAB — CBC WITH DIFFERENTIAL/PLATELET
Abs Immature Granulocytes: 0 10*3/uL (ref 0.00–0.07)
Basophils Absolute: 0 10*3/uL (ref 0.0–0.1)
Basophils Relative: 0 %
Eosinophils Absolute: 0.2 10*3/uL (ref 0.0–0.5)
Eosinophils Relative: 5 %
HCT: 35.6 % — ABNORMAL LOW (ref 39.0–52.0)
Hemoglobin: 11.4 g/dL — ABNORMAL LOW (ref 13.0–17.0)
Immature Granulocytes: 0 %
Lymphocytes Relative: 36 %
Lymphs Abs: 1 10*3/uL (ref 0.7–4.0)
MCH: 31.4 pg (ref 26.0–34.0)
MCHC: 32 g/dL (ref 30.0–36.0)
MCV: 98.1 fL (ref 80.0–100.0)
Monocytes Absolute: 0.3 10*3/uL (ref 0.1–1.0)
Monocytes Relative: 12 %
Neutro Abs: 1.3 10*3/uL — ABNORMAL LOW (ref 1.7–7.7)
Neutrophils Relative %: 47 %
Platelets: 157 10*3/uL (ref 150–400)
RBC: 3.63 MIL/uL — ABNORMAL LOW (ref 4.22–5.81)
RDW: 12.9 % (ref 11.5–15.5)
WBC: 2.8 10*3/uL — ABNORMAL LOW (ref 4.0–10.5)
nRBC: 0 % (ref 0.0–0.2)

## 2022-02-23 LAB — COMPREHENSIVE METABOLIC PANEL
ALT: 35 U/L (ref 0–44)
AST: 44 U/L — ABNORMAL HIGH (ref 15–41)
Albumin: 3.8 g/dL (ref 3.5–5.0)
Alkaline Phosphatase: 51 U/L (ref 38–126)
Anion gap: 7 (ref 5–15)
BUN: 9 mg/dL (ref 8–23)
CO2: 27 mmol/L (ref 22–32)
Calcium: 9.6 mg/dL (ref 8.9–10.3)
Chloride: 106 mmol/L (ref 98–111)
Creatinine, Ser: 0.96 mg/dL (ref 0.61–1.24)
GFR, Estimated: >60 mL/min (ref >60)
Glucose, Bld: 94 mg/dL (ref 70–99)
Potassium: 3.8 mmol/L (ref 3.5–5.1)
Sodium: 140 mmol/L (ref 135–145)
Total Bilirubin: 0.5 mg/dL (ref 0.3–1.2)
Total Protein: 7.4 g/dL (ref 6.5–8.1)

## 2022-02-23 LAB — LIPASE, BLOOD: Lipase: 23 U/L (ref 11–51)

## 2022-02-23 NOTE — ED Triage Notes (Addendum)
Pt trying to get colonoscopy schedule. Reports umbilical cramping pain that is constant in belly for months. He wants to know if there are certain things that he should be eating to helping. Denies blood and or tarry stools. Denies nausea, vomiting, diarrhea. 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 Reports no issues with appetite and denies issues with urination

## 2022-02-24 NOTE — Discharge Instructions (Signed)
[facility] Monday to [facility] for your [facility] as planned Return if you are having worsening abdominal pain especially if it is associated with nausea, vomiting, fevers, or worsening swelling

## 2022-02-24 NOTE — ED Provider Notes (Signed)
[facility] Provider Note   CSN: [id number] Arrival date & time: 02/23/22  1753     History  No chief complaint on file.   Jordan Nelson is a 71 y.o. male.  HPI 71 year old male history of enlarged prostate, history of arthritis presents today complaining of diffuse abdominal cramping for the past 2 to 3 months.  Reports that this has been constant in nature.  Has been seen by gastroenterology at the [facility] and is scheduled for a colonoscopy.  His first colonoscopy was scheduled a month ago and he was unable to go due to transportation issues.  He denies fever, chills, nausea, vomiting.  He states he has pain in the epigastrium that he was previously told was a hernia.  He does have some increased swelling without on standing but it does go flat when he lays down.  He has not noted any worsening symptoms with this.  He has had some weight loss over the past several months of 10 to 20 pounds.  He attributes this to improved diet higher in fiber and lowering cholesterol.  Not noted any blood or dark tarry stools.  He states that he came in this weekend to find out if there are any dietary things he could do to improve his symptoms.  He is not a drinker up and has quit smoking in the past several years.  He has not had a previous colonoscopy and does not have a significant family history of colorectal cancer.     Home Medications Prior to Admission medications   Medication Sig Start Date End Date Taking? Authorizing Provider  DULoxetine (CYMBALTA) 30 MG capsule Take 1 capsule by mouth daily. 10/27/20   [provider]  finasteride (PROSCAR) 5 MG tablet Take 1 tablet by mouth daily. 07/25/20   [provider]  Ipratropium-Albuterol (COMBIVENT) 20-100 MCG/ACT AERS respimat INHALE 1 PUFF BY MOUTH FOUR TIMES A DAY AS NEEDED - USE CONTINUOUSLY FOR 2 WEEKS THEN AS NEEDED FOR SHORTNESS OF BREATH 07/25/20   [provider]  lidocaine  (LIDODERM) 5 % APPLY 1 PATCH TO SKIN ONCE DAILY (APPLY FOR 12 HOURS, THEN REMOVE FOR 12 HOURS) 09/14/20   [provider]  meloxicam (MOBIC) 15 MG tablet Take by mouth. 01/03/20   [provider]  tamsulosin (FLOMAX) 0.4 MG CAPS capsule Take 1 capsule by mouth at bedtime. 01/16/21   [provider]  Tiotropium Bromide Monohydrate 1.25 MCG/ACT AERS INHALE 2 PUFFS BY MOUTH ONCE A DAY 09/24/20   [provider]  tiZANidine (ZANAFLEX) 4 MG tablet Take 1-2 tablets (4-8 mg total) by mouth every 6 (six) hours as needed for muscle spasms. 12/24/19   Raylene Everts, MD      Allergies    Patient has no known allergies.    Review of Systems   Review of Systems  Physical Exam Updated Vital Signs BP 122/89 (BP Location: Left Arm)   Pulse 62   Temp 98.2 F (36.8 C) (Oral)   Resp 17   SpO2 100%  Physical Exam Vitals and nursing note reviewed.  Constitutional:      General: He is not in acute distress.    Appearance: Normal appearance.  HENT:     Head: Normocephalic.     Right Ear: External ear normal.     Left Ear: External ear normal.     Nose: Nose normal.     Mouth/Throat:     Pharynx: Oropharynx is clear.  Eyes:     Extraocular Movements: Extraocular movements intact.     Pupils: Pupils are equal, round, and reactive to light.  Cardiovascular:     Rate and Rhythm: Normal rate and regular rhythm.     Pulses: Normal pulses.  Pulmonary:     Effort: Pulmonary effort is normal.     Breath sounds: Normal breath sounds.  Abdominal:     General: Abdomen is flat. Bowel sounds are normal.     Palpations: Abdomen is soft.     Comments: Small nontender swelling in the mid upper abdomen consistent with ventral hernia or lipoma  Musculoskeletal:        General: Normal range of motion.     Cervical back: Normal range of motion.  Skin:    General: Skin is warm.     Capillary Refill: Capillary refill takes less than 2 seconds.  Neurological:     General: No  focal deficit present.     Mental Status: He is alert.  Psychiatric:        Mood and Affect: Mood normal.     ED Results / Procedures / Treatments   Labs (all labs ordered are listed, but only abnormal results are displayed) Labs Reviewed  COMPREHENSIVE METABOLIC PANEL - Abnormal; Notable for the following components:      Result Value   AST 44 (*)    All other components within normal limits  CBC WITH DIFFERENTIAL/PLATELET - Abnormal; Notable for the following components:   WBC 2.8 (*)    RBC 3.63 (*)    Hemoglobin 11.4 (*)    HCT 35.6 (*)    Neutro Abs 1.3 (*)    All other components within normal limits  LIPASE, BLOOD    EKG None  Radiology No results found.  Procedures Procedures    Medications Ordered in ED Medications - No data to display  ED Course/ Medical Decision Making/ A&P Clinical Course as of 02/24/22 1045  Sun Feb 24, 2022  1045 CT reviewed and interpreted with mild leukopenia and anemia however stable from labs 4 years ago [DR]  1045 Pleat metabolic panel reviewed interpreted and significant for mildly elevated AST at 44 otherwise within normal limits Lipase reviewed interpreted and normal [DR]    Clinical Course User Index [DR] Margarita Grizzle, MD                           Medical Decision Making 71 year old male with crampy abdominal discomfort and weight loss over the past 3 months.  Patient without acute changes currently.  Patient evaluated here with labs with some mild abnormalities but stable from 4 years ago Differential diagnosis includes but is not limited to gut motility issues, colorectal cancer, other cancers, constipation, infectious etiology, pancreatitis, other acute intra-abdominal etiologies such as gallbladder colic, appendicitis, urinary tract infections Patient without focal findings on physical exam Labs essentially normal Pain has been chronic for 2 to 3 months Patient has scheduled follow-up with gastroenterologist and has  previously been seen from gastroenterology. On my exam evaluation there is not appear to be an acute emergent condition present Patient appears stable for outpatient follow-up We have discussed dietary interventions We have discussed return precautions and need for follow-up and patient voices understanding  Amount and/or Complexity of Data Reviewed Labs: ordered. Decision-making details documented in ED Course.           Final Clinical Impression(s) / ED Diagnoses Final diagnoses:  Chronic abdominal  pain  Weight loss    Rx / DC Orders ED Discharge Orders     None         Margarita Grizzle, MD 02/24/22 1048

## 2022-02-28 ENCOUNTER — Encounter (HOSPITAL_COMMUNITY): Payer: No Typology Code available for payment source

## 2022-02-28 ENCOUNTER — Ambulatory Visit: Payer: No Typology Code available for payment source | Admitting: Vascular Surgery

## 2022-03-01 ENCOUNTER — Ambulatory Visit (HOSPITAL_COMMUNITY)
Admission: RE | Admit: 2022-03-01 | Discharge: 2022-03-01 | Disposition: A | Discharge status: Home / Self Care | Payer: No Typology Code available for payment source | Source: Ambulatory Visit | Attending: Vascular Surgery | Admitting: Vascular Surgery

## 2022-03-01 DIAGNOSIS — I83891 Varicose veins of right lower extremities with other complications: Secondary | ICD-10-CM | POA: Diagnosis present

## 2022-03-04 NOTE — Progress Notes (Unsigned)
[facility]           [facility] [place]  History and Physical   Jordan Nelson is a 71 y.o. male who has been seen in the past for swelling.  He was last seen in December 2022 and at that time, he was having swelling in the left ankle and went to the Urgent Care and was diagnosed with achilles tendonitis and given NSAIDS and stretches and this did help.  He was wearing his 15-68mmHg compression and elevating his legs.    He had called at the end of May and reported having bleeding from a varicose vein on the top of the right foot and was able to stop it with compression.  He was scheduled for a RLE venous reflux study.    The patient has *** history of DVT. Pt does *** history of varicose vein.   Pt does *** history of skin changes in lower legs.   There is *** family history of venous disorders.      ***  The pt is not on a statin for cholesterol management.  The pt is not on a daily aspirin.   Other AC:  none The pt is not on medication for hypertension.   The pt is not diabetic.   Tobacco hx:  former   Past Medical History:  Diagnosis Date   Arthritis    Benign enlargement of prostate    Chronic back pain    Epigastric hernia    GERD (gastroesophageal reflux disease)    Numbness    right hand   Pollen allergy    Scoliosis    " minor"   Tuberculosis    early 1980's   Venous insufficiency (chronic) (peripheral)    Wears glasses     Past Surgical History:  Procedure Laterality Date   EYE SURGERY     HERNIA REPAIR      Social History   Socioeconomic History   Marital status: Single    Spouse name: Not on file   Number of children: Not on file   Years of education: Not on file   Highest education level: Not on file  Occupational History   Occupation: Semi-Retired  Tobacco Use   Smoking status: Former    Types: Cigarettes   Smokeless tobacco: Never  Vaping Use   Vaping Use: Never used  Substance and Sexual Activity   Alcohol  use: No   Drug use: No   Sexual activity: Not on file  Other Topics Concern   Not on file  Social History Narrative   Not on file   Social Determinants of Health   Financial Resource Strain: Not on file  Food Insecurity: Not on file  Transportation Needs: Not on file  Physical Activity: Not on file  Stress: Not on file  Social Connections: Not on file  Intimate Partner Violence: Not on file    *** Family History  Problem Relation Age of Onset   Hypertension Mother     Current Outpatient Medications  Medication Sig Dispense Refill   DULoxetine (CYMBALTA) 30 MG capsule Take 1 capsule by mouth daily.     finasteride (PROSCAR) 5 MG tablet Take 1 tablet by mouth daily.     Ipratropium-Albuterol (COMBIVENT) 20-100 MCG/ACT AERS respimat INHALE 1 PUFF BY MOUTH FOUR TIMES A DAY AS NEEDED - USE CONTINUOUSLY FOR 2 WEEKS THEN AS NEEDED FOR SHORTNESS OF BREATH     lidocaine (  LIDODERM) 5 % APPLY 1 PATCH TO SKIN ONCE DAILY (APPLY FOR 12 HOURS, THEN REMOVE FOR 12 HOURS)     meloxicam (MOBIC) 15 MG tablet Take by mouth.     tamsulosin (FLOMAX) 0.4 MG CAPS capsule Take 1 capsule by mouth at bedtime.     Tiotropium Bromide Monohydrate 1.25 MCG/ACT AERS INHALE 2 PUFFS BY MOUTH ONCE A DAY     tiZANidine (ZANAFLEX) 4 MG tablet Take 1-2 tablets (4-8 mg total) by mouth every 6 (six) hours as needed for muscle spasms. 30 tablet 1   No current facility-administered medications for this visit.    No Known Allergies  REVIEW OF SYSTEMS:  *** [X]  denotes positive finding, [ ]  denotes negative finding Cardiac  Comments:  Chest pain or chest pressure:    Shortness of breath upon exertion:    Short of breath when lying flat:    Irregular heart rhythm:        Vascular    Pain in calf, thigh, or hip brought on by ambulation:    Pain in feet at night that wakes you up from your sleep:     Blood clot in your veins:    Leg swelling:  x       Pulmonary    Oxygen at home:    Productive cough:      Wheezing:         Neurologic    Sudden weakness in arms or legs:     Sudden numbness in arms or legs:     Sudden onset of difficulty speaking or slurred speech:    Temporary loss of vision in one eye:     Problems with dizziness:         Gastrointestinal    Blood in stool:     Vomited blood:         Genitourinary    Burning when urinating:     Blood in urine:        Psychiatric    Major depression:         Hematologic    Bleeding problems:    Problems with blood clotting too easily:        Skin    Rashes or ulcers:        Constitutional    Fever or chills:      PHYSICAL EXAMINATION:  ***  General:  WDWN in NAD; vital signs documented above Gait: Not observed HENT: WNL, normocephalic Pulmonary: normal non-labored breathing without wheezing Cardiac: {Desc; regular/irreg:14544} HR; {With/Without:20273} carotid bruit*** Abdomen: soft, NT, no masses; aortic pulse is *** palpable Skin: {With/Without:20273} rashes Vascular Exam/Pulses:  Right Left  Radial {Exam; arterial pulse strength 0-4:30167} {Exam; arterial pulse strength 0-4:30167}  DP {Exam; arterial pulse strength 0-4:30167} {Exam; arterial pulse strength 0-4:30167}  PT {Exam; arterial pulse strength 0-4:30167} {Exam; arterial pulse strength 0-4:30167}   Extremities: ***  Neurologic: A&O X 3;  moving all extremities equally Psychiatric:  The pt has {Desc; normal/abnormal:11317::"Normal"} affect.   Non-Invasive Vascular Imaging:   Venous duplex on 03/02/2022: Venous Reflux Times  +--------------+---------+------+-----------+------------+------------+  RIGHT         Reflux NoRefluxReflux TimeDiameter cmsComments                              Yes                                       +--------------+---------+------+-----------+------------+------------+  CFV                     yes   >1 second                            +--------------+---------+------+-----------+------------+------------+  FV mid        no                                                  +--------------+---------+------+-----------+------------+------------+  Popliteal     no                                                  +--------------+---------+------+-----------+------------+------------+  GSV at [facility]    no                            0.31                  +--------------+---------+------+-----------+------------+------------+  GSV prox thighno                            0.29                  +--------------+---------+------+-----------+------------+------------+  GSV mid thigh no                            0.26                  +--------------+---------+------+-----------+------------+------------+  GSV dist thighno                            0.26                  +--------------+---------+------+-----------+------------+------------+  GSV at knee   no                            0.24                  +--------------+---------+------+-----------+------------+------------+  GSV prox calf no                            0.27                  +--------------+---------+------+-----------+------------+------------+  GSV mid calf            yes    >500 ms      0.20    0.22 cm deep  +--------------+---------+------+-----------+------------+------------+  SSV Pop Fossa no                            0.18                  +--------------+---------+------+-----------+------------+------------+  perforator    no  0.25                  +--------------+---------+------+-----------+------------+------------+   Summary:  Right:  - No evidence of deep vein thrombosis from the common femoral through the popliteal veins.  - No evidence of superficial venous thrombosis.  - The common femoral vein is not competent.  - The great saphenous vein is not  competent in the mid calf only.  - The small saphenous vein is competent.  - Perforator observed in the mid calf.    GEOFFRY BANNISTER is a 71 y.o. male who presents with: ***  -pt has *** pedal pulses -pt does not have evidence of DVT.  Pt does have venous reflux in the CFV and the GSV in the mid calf.  He is not a candidate for laser ablation.   -discussed with pt about continuing to wear knee high 15-20 mmHg compression stockings and pt was measured for these today.   *** -discussed the importance of leg elevation and how to elevate properly - pt is advised to elevate their legs and a diagram is given to them to demonstrate for pt to lay flat on their back with knees elevated and slightly bent with their feet higher than their knees, which puts their feet higher than their heart for 15 minutes per day.  If pt cannot lay flat, advised to lay as flat as possible.  -pt is advised to continue as much walking as possible and avoid sitting or standing for long periods of time.  -discussed importance of weight loss and exercise and that water aerobics would also be beneficial.  -handout with recommendations given -pt will f/u ***   Doreatha Massed, [facility] [phone]  Clinic MD:  Chestine Spore

## 2022-03-05 ENCOUNTER — Ambulatory Visit (INDEPENDENT_AMBULATORY_CARE_PROVIDER_SITE_OTHER): Payer: Medicare HMO | Admitting: Physician Assistant

## 2022-03-05 ENCOUNTER — Encounter: Payer: Self-pay | Admitting: Physician Assistant

## 2022-03-05 VITALS — BP 109/69 | HR 68 | Temp 98.6°F | Resp 20 | Ht 71.0 in | Wt 164.6 lb

## 2022-03-05 DIAGNOSIS — I872 Venous insufficiency (chronic) (peripheral): Secondary | ICD-10-CM

## 2022-03-08 ENCOUNTER — Encounter (HOSPITAL_COMMUNITY): Payer: Self-pay | Admitting: *Deleted

## 2022-03-08 ENCOUNTER — Emergency Department (HOSPITAL_COMMUNITY)
Admission: EM | Admit: 2022-03-08 | Discharge: 2022-03-09 | Disposition: A | Discharge status: Home / Self Care | Payer: No Typology Code available for payment source | Attending: Emergency Medicine | Admitting: Emergency Medicine

## 2022-03-08 ENCOUNTER — Other Ambulatory Visit: Payer: Self-pay

## 2022-03-08 DIAGNOSIS — N281 Cyst of kidney, acquired: Secondary | ICD-10-CM | POA: Diagnosis not present

## 2022-03-08 DIAGNOSIS — R109 Unspecified abdominal pain: Secondary | ICD-10-CM | POA: Diagnosis present

## 2022-03-08 DIAGNOSIS — K439 Ventral hernia without obstruction or gangrene: Secondary | ICD-10-CM

## 2022-03-08 DIAGNOSIS — K469 Unspecified abdominal hernia without obstruction or gangrene: Secondary | ICD-10-CM | POA: Insufficient documentation

## 2022-03-08 DIAGNOSIS — N3289 Other specified disorders of bladder: Secondary | ICD-10-CM | POA: Diagnosis not present

## 2022-03-08 DIAGNOSIS — R1013 Epigastric pain: Secondary | ICD-10-CM

## 2022-03-08 LAB — CBC
HCT: 35.6 % — ABNORMAL LOW (ref 39.0–52.0)
Hemoglobin: 11.7 g/dL — ABNORMAL LOW (ref 13.0–17.0)
MCH: 32 pg (ref 26.0–34.0)
MCHC: 32.9 g/dL (ref 30.0–36.0)
MCV: 97.3 fL (ref 80.0–100.0)
Platelets: 143 10*3/uL — ABNORMAL LOW (ref 150–400)
RBC: 3.66 MIL/uL — ABNORMAL LOW (ref 4.22–5.81)
RDW: 12.9 % (ref 11.5–15.5)
WBC: 2.7 10*3/uL — ABNORMAL LOW (ref 4.0–10.5)
nRBC: 0 % (ref 0.0–0.2)

## 2022-03-08 LAB — URINALYSIS, ROUTINE W REFLEX MICROSCOPIC
Bilirubin Urine: NEGATIVE
Glucose, UA: NEGATIVE mg/dL
Hgb urine dipstick: NEGATIVE
Ketones, ur: NEGATIVE mg/dL
Leukocytes,Ua: NEGATIVE
Nitrite: NEGATIVE
Protein, ur: NEGATIVE mg/dL
Specific Gravity, Urine: 1.011 (ref 1.005–1.030)
pH: 5 (ref 5.0–8.0)

## 2022-03-08 LAB — COMPREHENSIVE METABOLIC PANEL
ALT: 31 U/L (ref 0–44)
AST: 45 U/L — ABNORMAL HIGH (ref 15–41)
Albumin: 3.7 g/dL (ref 3.5–5.0)
Alkaline Phosphatase: 48 U/L (ref 38–126)
Anion gap: 6 (ref 5–15)
BUN: 5 mg/dL — ABNORMAL LOW (ref 8–23)
CO2: 28 mmol/L (ref 22–32)
Calcium: 9.5 mg/dL (ref 8.9–10.3)
Chloride: 106 mmol/L (ref 98–111)
Creatinine, Ser: 0.95 mg/dL (ref 0.61–1.24)
GFR, Estimated: >60 mL/min (ref >60)
Glucose, Bld: 90 mg/dL (ref 70–99)
Potassium: 3.9 mmol/L (ref 3.5–5.1)
Sodium: 140 mmol/L (ref 135–145)
Total Bilirubin: 0.6 mg/dL (ref 0.3–1.2)
Total Protein: 7.6 g/dL (ref 6.5–8.1)

## 2022-03-08 LAB — LIPASE, BLOOD: Lipase: 23 U/L (ref 11–51)

## 2022-03-08 NOTE — ED Notes (Signed)
The pt is c/o abd pain for 3 months [facility]  [facility] soon

## 2022-03-08 NOTE — ED Triage Notes (Signed)
Pt having centralized umbilical pain or months; described as swelling, tightness, aching. Denies NVD Reports constant tightness in abd

## 2022-03-09 ENCOUNTER — Emergency Department (HOSPITAL_COMMUNITY): Payer: No Typology Code available for payment source

## 2022-03-09 DIAGNOSIS — N3289 Other specified disorders of bladder: Secondary | ICD-10-CM | POA: Diagnosis not present

## 2022-03-09 DIAGNOSIS — N281 Cyst of kidney, acquired: Secondary | ICD-10-CM | POA: Diagnosis not present

## 2022-03-09 MED ORDER — IOHEXOL 300 MG/ML  SOLN
80.0000 mL | Freq: Once | INTRAMUSCULAR | Status: AC | PRN
  Administered 2022-03-09: 80 mL via INTRAVENOUS

## 2022-03-09 NOTE — ED Provider Notes (Signed)
[facility] EMERGENCY DEPARTMENT Provider Note   CSN: [id number] Arrival date & time: 03/08/22  1857     History  Chief Complaint  Patient presents with   Abdominal Pain    Jordan Nelson is a 71 y.o. male.  The history is provided by the patient and medical records.  Abdominal Pain Jordan Nelson is a 71 y.o. male who presents to the Emergency Department complaining of abdominal pain.  He presents to the emergency department complaining of several months of abdominal cramping.  Pain is constant in nature but is sometimes gone when he wakes up.  Pain is worse when he moves and he feels stiff in his abdomen if he sits too long.  He did change his diet about a week ago to avoid fatty food and feels like this has not changed just pain.  No associated fevers, chest pain, shortness of breath, nausea, vomiting.  He does have occasional diarrhea.  No significant change in his symptoms with meals.  He also has a hernia and feels like this is getting bigger.  The hernia does go away when he lays flat at night.  He occasionally has discomfort at the hernia site.  He is followed by the [facility] and is scheduled to follow-up with gastroenterology but does not know when this will occur.  He presents tonight due to uncertainty about when his follow-up will be.   History of BPH, arthritis.    No tobacco.  Former drinker - no drinks in 3-4 years.  No drugs.       Home Medications Prior to Admission medications   Medication Sig Start Date End Date Taking? Authorizing Provider  DULoxetine (CYMBALTA) 30 MG capsule Take 1 capsule by mouth daily. 10/27/20   [provider]  finasteride (PROSCAR) 5 MG tablet Take 1 tablet by mouth daily. 07/25/20   [provider]  Ipratropium-Albuterol (COMBIVENT) 20-100 MCG/ACT AERS respimat INHALE 1 PUFF BY MOUTH FOUR TIMES A DAY AS NEEDED - USE CONTINUOUSLY FOR 2 WEEKS THEN AS NEEDED FOR SHORTNESS OF BREATH 07/25/20   [provider]  lidocaine (LIDODERM) 5 % APPLY 1 PATCH TO SKIN ONCE DAILY (APPLY FOR 12 HOURS, THEN REMOVE FOR 12 HOURS) 09/14/20   [provider]  meloxicam (MOBIC) 15 MG tablet Take by mouth. 01/03/20   [provider]  tamsulosin (FLOMAX) 0.4 MG CAPS capsule Take 1 capsule by mouth at bedtime. 01/16/21   [provider]  Tiotropium Bromide Monohydrate 1.25 MCG/ACT AERS INHALE 2 PUFFS BY MOUTH ONCE A DAY 09/24/20   [provider]  tiZANidine (ZANAFLEX) 4 MG tablet Take 1-2 tablets (4-8 mg total) by mouth every 6 (six) hours as needed for muscle spasms. 12/24/19   Eustace Moore, MD      Allergies    Patient has no known allergies.    Review of Systems   Review of Systems  Gastrointestinal:  Positive for abdominal pain.  All other systems reviewed and are negative.   Physical Exam Updated Vital Signs BP (!) 122/91   Pulse (!) 58   Temp (!) 97.4 F (36.3 C)   Resp 15   SpO2 100%  Physical Exam Vitals and nursing note reviewed.  Constitutional:      Appearance: He is well-developed.  HENT:     Head: Normocephalic and atraumatic.  Cardiovascular:     Rate and Rhythm: Normal rate and regular rhythm.     Heart sounds: No murmur heard. Pulmonary:  Effort: Pulmonary effort is normal. No respiratory distress.     Breath sounds: Normal breath sounds.  Abdominal:     Palpations: Abdomen is soft.     Tenderness: There is no guarding or rebound.     Comments: Supraumbilical hernia, minimally tender to palpation, soft.   Musculoskeletal:        General: No tenderness.  Skin:    General: Skin is warm and dry.  Neurological:     Mental Status: He is alert and oriented to person, place, and time.  Psychiatric:        Behavior: Behavior normal.     ED Results / Procedures / Treatments   Labs (all labs ordered are listed, but only abnormal results are displayed) Labs Reviewed  COMPREHENSIVE METABOLIC PANEL - Abnormal; Notable for the  following components:      Result Value   BUN 5 (*)    AST 45 (*)    All other components within normal limits  CBC - Abnormal; Notable for the following components:   WBC 2.7 (*)    RBC 3.66 (*)    Hemoglobin 11.7 (*)    HCT 35.6 (*)    Platelets 143 (*)    All other components within normal limits  LIPASE, BLOOD  URINALYSIS, ROUTINE W REFLEX MICROSCOPIC    EKG None  Radiology CT Abdomen Pelvis W Contrast  Result Date: 03/09/2022 CLINICAL DATA:  Acute abdominal pain EXAM: CT ABDOMEN AND PELVIS WITH CONTRAST TECHNIQUE: Multidetector CT imaging of the abdomen and pelvis was performed using the standard protocol following bolus administration of intravenous contrast. RADIATION DOSE REDUCTION: This exam was performed according to the departmental dose-optimization program which includes automated exposure control, adjustment of the mA and/or kV according to patient size and/or use of iterative reconstruction technique. CONTRAST:  71mL OMNIPAQUE IOHEXOL 300 MG/ML  SOLN COMPARISON:  10/23/2017 FINDINGS: Lower chest: No acute abnormality. Hepatobiliary: No focal liver abnormality is seen. No gallstones, gallbladder wall thickening, or biliary dilatation. Pancreas: Unremarkable. No pancreatic ductal dilatation or surrounding inflammatory changes. Spleen: Normal in size without focal abnormality. Adrenals/Urinary Tract: Adrenal glands are within normal limits. Kidneys are well visualized bilaterally. No renal calculi or obstructive changes are seen. Normal excretion is noted bilaterally. Small 1 cm hypodense lesion is noted in the upper pole of the right kidney likely representing a simple cyst. This has increased slightly in the interval from the prior exam. No obstructive changes are seen. The bladder is partially distended. Stomach/Bowel: The appendix is within normal limits. Scattered fecal material is noted throughout the colon without obstructive change. The stomach and small bowel show no acute  abnormality. Vascular/Lymphatic: Aortic atherosclerosis. No enlarged abdominal or pelvic lymph nodes. Reproductive: Prostate is unremarkable. Other: No abdominal wall hernia or abnormality. No abdominopelvic ascites. Musculoskeletal: Scoliosis is noted concave to the left at the thoracolumbar junction. Multilevel osteophytic changes are seen. No compression deformity is noted. IMPRESSION: No acute abnormality is noted to correspond with the given clinical history. 1 cm right Bosniak 1 benign simple cyst. No follow-up imaging is recommended. JACR 2018 Feb; 264-273, Management of the Incidental RenalMass on CT, [employer] 2021; 814-848, Bosniak Classification of Cystic Renal Masses, Version 2019. Electronically Signed   By: Alcide Clever M.D.   On: 03/09/2022 02:14    Procedures Procedures    Medications Ordered in ED Medications  iohexol (OMNIPAQUE) 300 MG/ML solution 80 mL (80 mLs Intravenous Contrast Given 03/09/22 0207)    ED Course/ Medical Decision Making/ A&P  Medical Decision Making Amount and/or Complexity of Data Reviewed Labs: ordered. Radiology: ordered.  Risk Prescription drug management.   Patient here for evaluation of several months of abdominal pain.  He has no significant tenderness on examination.  He does have a supraumbilical hernia with minimal tenderness.  No evidence of incarceration on imaging or examination.  Labs with stable leukopenia and anemia.  He has mild thrombocytopenia when compared to prior.  AST minimally elevated, similar when compared to priors.  UA not consistent with UTI.  No evidence of acute pancreatitis, SBO or infectious process.  Discussed with patient unclear source of symptoms.  Feel he is stable for outpatient follow-up with gastroenterology at the [facility] as well as surgery regarding his hernia as needed.  Discussed return precautions.        Final Clinical Impression(s) / ED Diagnoses Final diagnoses:  Epigastric  pain  Hernia of anterior abdominal wall    Rx / DC Orders ED Discharge Orders     None         Tilden Fossa, MD 03/09/22 [phone]

## 2022-03-25 ENCOUNTER — Other Ambulatory Visit: Payer: Self-pay | Admitting: Surgery

## 2022-03-25 DIAGNOSIS — K420 Umbilical hernia with obstruction, without gangrene: Secondary | ICD-10-CM | POA: Diagnosis not present

## 2022-04-08 ENCOUNTER — Other Ambulatory Visit: Payer: Self-pay

## 2022-04-08 ENCOUNTER — Encounter (HOSPITAL_BASED_OUTPATIENT_CLINIC_OR_DEPARTMENT_OTHER): Payer: Self-pay | Admitting: Surgery

## 2022-04-08 MED ORDER — ENSURE PRE-SURGERY PO LIQD: 296.0000 mL | Freq: Once | ORAL | Status: DC

## 2022-04-08 NOTE — Progress Notes (Signed)

## 2022-04-09 NOTE — Anesthesia Preprocedure Evaluation (Addendum)
Anesthesia Evaluation  Patient identified by MRN, date of birth, ID band Patient awake    Reviewed: Allergy & Precautions, H&P , NPO status , Patient's Chart, lab work & pertinent test results  Airway Mallampati: I  TM Distance: >3 FB Neck ROM: Full    Dental no notable dental hx. (+) Teeth Intact, Dental Advisory Given, Missing   Pulmonary neg pulmonary ROS, former smoker,    Pulmonary exam normal breath sounds clear to auscultation       Cardiovascular Exercise Tolerance: Good negative cardio ROS Normal cardiovascular exam Rhythm:Regular Rate:Normal     Neuro/Psych negative neurological ROS  negative psych ROS   GI/Hepatic negative GI ROS, Neg liver ROS, GERD  Medicated,  Endo/Other  negative endocrine ROS  Renal/GU negative Renal ROS  negative genitourinary   Musculoskeletal negative musculoskeletal ROS (+) Arthritis , Osteoarthritis,    Abdominal   Peds negative pediatric ROS (+)  Hematology negative hematology ROS (+) Blood dyscrasia, anemia ,   Anesthesia Other Findings   Reproductive/Obstetrics negative OB ROS                           Anesthesia Physical Anesthesia Plan  ASA: 3  Anesthesia Plan: General   Post-op Pain Management: Tylenol PO (pre-op)* and Celebrex PO (pre-op)*   Induction: Intravenous  PONV Risk Score and Plan: 2 and Ondansetron and Dexamethasone  Airway Management Planned: Oral ETT and LMA  Additional Equipment: None  Intra-op Plan:   Post-operative Plan: Extubation in OR  Informed Consent: I have reviewed the patients History and Physical, chart, labs and discussed the procedure including the risks, benefits and alternatives for the proposed anesthesia with the patient or authorized representative who has indicated his/her understanding and acceptance.       Plan Discussed with: Anesthesiologist  Anesthesia Plan Comments: (  )         Anesthesia Quick Evaluation

## 2022-04-09 NOTE — H&P (Signed)
REFERRING PHYSICIAN: Self  PROVIDER: Wayne Both, MD  MRN: [id number] DOB: Nov 15, 1950 DATE OF ENCOUNTER: 03/25/2022 Subjective   Chief Complaint: New Consultation (Hernia )   History of Present Illness: Jordan Nelson is a 71 y.o. male who is seen today as an office consultation for evaluation of New Consultation (Hernia ) .   This is a very pleasant 71 year old gentleman who was referred here for a symptomatic hernia near his umbilicus. He has been having cramping abdominal pain for several months. He has had no nausea or vomiting. Is not related to the food that he eats. He reports he notices a bulge above the umbilicus which will reduce at night. He has no abdominal pain today. He actually sees a gastroenterologist at the [place] and is to undergo a colonoscopy this Friday.  He had a CT scan of his abdomen pelvis which was read as normal with no evidence of hernia.  He has had no previous abdominal surgery. He has no cardiopulmonary issues  Review of Systems: A complete review of systems was obtained from the patient. I have reviewed this information and discussed as appropriate with the patient. See HPI as well for other ROS.  ROS   Medical History: Past Medical History:  Diagnosis Date  Anemia  Arthritis  Asthma, unspecified asthma severity, unspecified whether complicated, unspecified whether persistent   Patient Active Problem List  Diagnosis  Achilles tendinitis, left leg  Age-related nuclear cataract, bilateral  Anemia, unspecified  Benign prostatic hyperplasia with lower urinary tract symptoms  Carpal tunnel syndrome  Centrilobular emphysema (CMS-HCC)  Corn of foot  Decreased white blood cell count, unspecified  Leukopenia  Edema, unspecified  Elevated blood-pressure reading, without diagnosis of hypertension  Epistaxis  Irritable bowel syndrome with constipation  Low back pain, unspecified  Encounter for other preprocedural examination  Right sided  sciatica  Degenerative joint disease (DJD) of lumbar spine  Sebaceous cyst  Tinea unguium  Lower abdominal pain, unspecified  Asymptomatic varicose veins of right lower extremity  Venous insufficiency (chronic) (peripheral)   History reviewed. No pertinent surgical history.   No Known Allergies  Current Outpatient Medications on File Prior to Visit  Medication Sig Dispense Refill  DULoxetine (CYMBALTA) 30 MG DR capsule Take 1 capsule by mouth 2 (two) times daily  DULoxetine (DRIZALMA SPRINKLE) 40 mg CDRS Take by mouth daily  finasteride (PROSCAR) 5 mg tablet Take 1 tablet by mouth once daily  ipratropium-albuteroL (COMBIVENT RESPIMAT) 20-100 mcg/actuation inhaler INHALE 1 PUFF BY MOUTH FOUR TIMES A DAY AS NEEDED - USE CONTINUOUSLY FOR 2 WEEKS THEN AS NEEDED FOR SHORTNESS OF BREATH  meloxicam (MOBIC) 15 MG tablet Take 1 tablet by mouth once daily  tamsulosin (FLOMAX) 0.4 mg capsule Take 1 capsule by mouth at bedtime  tiotropium bromide (SPIRIVA RESPIMAT) 1.25 mcg/actuation inhalation spray INHALE 2 PUFFS BY MOUTH ONCE A DAY   No current facility-administered medications on file prior to visit.   History reviewed. No pertinent family history.   Social History   Tobacco Use  Smoking Status Former  Types: Cigarettes  Quit date: 2018  Years since quitting: 5.6  Smokeless Tobacco Never    Social History   Socioeconomic History  Marital status: Single  Tobacco Use  Smoking status: Former  Types: Cigarettes  Quit date: 2018  Years since quitting: 5.6  Smokeless tobacco: Never  Substance and Sexual Activity  Alcohol use: Yes  Drug use: Never   Objective:   Vitals:  03/25/22 1531  BP: 130/70  Pulse:  60  Temp: 36.9 C (98.5 F)  SpO2: 98%  Weight: 74.8 kg (164 lb 12.8 oz)  Height: 180.3 cm (5\' 11" )   Body mass index is 22.98 kg/m.  Physical Exam   He appears well on exam  Today his abdomen is soft and nontender  There is an obvious incarcerated hernia above  the umbilicus. Based on the CT scan, the fascial defect is 1 cm but there is at least 3 to 4 cm of incarcerated omentum in the hernia. It is nontender  Labs, Imaging and Diagnostic Testing: I have reviewed the CT scan images most recently performed of the abdomen and pelvis  Assessment and Plan:   Diagnoses and all orders for this visit:  Incarcerated umbilical hernia    This is an obvious hernia based on physical examination. He is actually thin and this is quite visible. I discussed abdominal wall anatomy with him. I believe his symptoms are related to the hernia itself. I do not think there is bowel involved in the hernia currently. He is due for a colonoscopy this Friday and has never had 1 set believe he needs to go ahead and proceed with a colonoscopy and we will be scheduling his surgery to follow this in the next week or so. I discussed hernia pair with him in general. This would be a small open repair with mesh. I discussed the risk of surgery which includes but is not limited to bleeding, infection, use of mesh, hernia recurrence, injury to surrounding structures, postoperative recovery, etc. He understands and wishes to proceed with surgery which will be scheduled

## 2022-04-10 ENCOUNTER — Other Ambulatory Visit: Payer: Self-pay

## 2022-04-10 ENCOUNTER — Ambulatory Visit (HOSPITAL_BASED_OUTPATIENT_CLINIC_OR_DEPARTMENT_OTHER): Payer: Medicare HMO | Admitting: Anesthesiology

## 2022-04-10 ENCOUNTER — Encounter (HOSPITAL_BASED_OUTPATIENT_CLINIC_OR_DEPARTMENT_OTHER)
Admission: RE | Disposition: A | Discharge status: Home / Self Care | Payer: Self-pay | Source: Home / Self Care | Attending: Surgery

## 2022-04-10 ENCOUNTER — Ambulatory Visit (HOSPITAL_BASED_OUTPATIENT_CLINIC_OR_DEPARTMENT_OTHER)
Admission: RE | Admit: 2022-04-10 | Discharge: 2022-04-11 | Disposition: A | Discharge status: Home / Self Care | Payer: Medicare HMO | Attending: Surgery | Admitting: Surgery

## 2022-04-10 ENCOUNTER — Encounter (HOSPITAL_BASED_OUTPATIENT_CLINIC_OR_DEPARTMENT_OTHER): Payer: Self-pay | Admitting: Surgery

## 2022-04-10 DIAGNOSIS — K420 Umbilical hernia with obstruction, without gangrene: Secondary | ICD-10-CM

## 2022-04-10 DIAGNOSIS — Z87891 Personal history of nicotine dependence: Secondary | ICD-10-CM | POA: Diagnosis not present

## 2022-04-10 DIAGNOSIS — M1990 Unspecified osteoarthritis, unspecified site: Secondary | ICD-10-CM | POA: Diagnosis not present

## 2022-04-10 DIAGNOSIS — Z01818 Encounter for other preprocedural examination: Secondary | ICD-10-CM

## 2022-04-10 DIAGNOSIS — D649 Anemia, unspecified: Secondary | ICD-10-CM

## 2022-04-10 DIAGNOSIS — K436 Other and unspecified ventral hernia with obstruction, without gangrene: Secondary | ICD-10-CM | POA: Diagnosis present

## 2022-04-10 HISTORY — PX: UMBILICAL HERNIA REPAIR: SHX196

## 2022-04-10 SURGERY — REPAIR, HERNIA, UMBILICAL, ADULT
Anesthesia: General | Site: Abdomen

## 2022-04-10 MED ORDER — CEFAZOLIN SODIUM-DEXTROSE 2-4 GM/100ML-% IV SOLN
2.0000 g | INTRAVENOUS | Status: AC
  Administered 2022-04-10: 2 g via INTRAVENOUS

## 2022-04-10 MED ORDER — TAMSULOSIN HCL 0.4 MG PO CAPS
0.4000 mg | ORAL_CAPSULE | Freq: Every day | ORAL | Status: DC
  Filled 2022-04-10: qty 1

## 2022-04-10 MED ORDER — ACETAMINOPHEN 500 MG PO TABS
1000.0000 mg | ORAL_TABLET | ORAL | Status: AC
  Administered 2022-04-10: 1000 mg via ORAL

## 2022-04-10 MED ORDER — FINASTERIDE 5 MG PO TABS
5.0000 mg | ORAL_TABLET | Freq: Every day | ORAL | Status: DC
  Filled 2022-04-10: qty 1

## 2022-04-10 MED ORDER — KETOROLAC TROMETHAMINE 15 MG/ML IJ SOLN
INTRAMUSCULAR | Status: DC | PRN
  Administered 2022-04-10: 15 mg via INTRAVENOUS

## 2022-04-10 MED ORDER — DIPHENHYDRAMINE HCL 50 MG/ML IJ SOLN: 12.5000 mg | Freq: Four times a day (QID) | INTRAMUSCULAR | Status: DC | PRN

## 2022-04-10 MED ORDER — ONDANSETRON HCL 4 MG/2ML IJ SOLN
INTRAMUSCULAR | Status: DC | PRN
  Administered 2022-04-10: 4 mg via INTRAVENOUS

## 2022-04-10 MED ORDER — ACETAMINOPHEN 325 MG PO TABS: 325.0000 mg | ORAL_TABLET | ORAL | Status: DC | PRN

## 2022-04-10 MED ORDER — CHLORHEXIDINE GLUCONATE CLOTH 2 % EX PADS: 6.0000 | MEDICATED_PAD | Freq: Once | CUTANEOUS | Status: DC

## 2022-04-10 MED ORDER — ONDANSETRON HCL 4 MG/2ML IJ SOLN
INTRAMUSCULAR | Status: AC
  Filled 2022-04-10: qty 2

## 2022-04-10 MED ORDER — CEFAZOLIN SODIUM-DEXTROSE 2-4 GM/100ML-% IV SOLN
INTRAVENOUS | Status: AC
  Filled 2022-04-10: qty 100

## 2022-04-10 MED ORDER — PROPOFOL 10 MG/ML IV BOLUS
INTRAVENOUS | Status: DC | PRN
  Administered 2022-04-10: 180 mg via INTRAVENOUS

## 2022-04-10 MED ORDER — HYDROMORPHONE HCL 1 MG/ML IJ SOLN: 1.0000 mg | INTRAMUSCULAR | Status: DC | PRN

## 2022-04-10 MED ORDER — PROPOFOL 10 MG/ML IV BOLUS
INTRAVENOUS | Status: AC
  Filled 2022-04-10: qty 20

## 2022-04-10 MED ORDER — FENTANYL CITRATE (PF) 100 MCG/2ML IJ SOLN
INTRAMUSCULAR | Status: DC | PRN
  Administered 2022-04-10: 50 ug via INTRAVENOUS

## 2022-04-10 MED ORDER — OXYCODONE HCL 5 MG PO TABS: 5.0000 mg | ORAL_TABLET | ORAL | Status: DC | PRN

## 2022-04-10 MED ORDER — BUPIVACAINE-EPINEPHRINE 0.5% -1:200000 IJ SOLN
INTRAMUSCULAR | Status: DC | PRN
  Administered 2022-04-10: 20 mL

## 2022-04-10 MED ORDER — MEPERIDINE HCL 25 MG/ML IJ SOLN: 6.2500 mg | INTRAMUSCULAR | Status: DC | PRN

## 2022-04-10 MED ORDER — ACETAMINOPHEN 160 MG/5ML PO SOLN: 325.0000 mg | ORAL | Status: DC | PRN

## 2022-04-10 MED ORDER — LACTATED RINGERS IV SOLN
INTRAVENOUS | Status: DC
Start: 2022-04-10 — End: 2022-04-10

## 2022-04-10 MED ORDER — TRAMADOL HCL 50 MG PO TABS: 50.0000 mg | ORAL_TABLET | Freq: Four times a day (QID) | ORAL | Status: DC | PRN

## 2022-04-10 MED ORDER — ENOXAPARIN SODIUM 40 MG/0.4ML IJ SOSY
40.0000 mg | PREFILLED_SYRINGE | INTRAMUSCULAR | Status: DC
Start: 2022-04-11 — End: 2022-04-11
  Administered 2022-04-11: 40 mg via SUBCUTANEOUS
  Filled 2022-04-10: qty 0.4

## 2022-04-10 MED ORDER — ONDANSETRON HCL 4 MG/2ML IJ SOLN: 4.0000 mg | Freq: Once | INTRAMUSCULAR | Status: DC | PRN

## 2022-04-10 MED ORDER — PHENYLEPHRINE HCL (PRESSORS) 10 MG/ML IV SOLN
INTRAVENOUS | Status: DC | PRN
  Administered 2022-04-10 (×3): 80 ug via INTRAVENOUS
  Administered 2022-04-10 (×2): 120 ug via INTRAVENOUS

## 2022-04-10 MED ORDER — DEXAMETHASONE SODIUM PHOSPHATE 10 MG/ML IJ SOLN
INTRAMUSCULAR | Status: AC
  Filled 2022-04-10: qty 1

## 2022-04-10 MED ORDER — SODIUM CHLORIDE 0.9 % IV SOLN: INTRAVENOUS | Status: DC

## 2022-04-10 MED ORDER — LIDOCAINE HCL (CARDIAC) PF 100 MG/5ML IV SOSY
PREFILLED_SYRINGE | INTRAVENOUS | Status: DC | PRN
  Administered 2022-04-10: 50 mg via INTRAVENOUS

## 2022-04-10 MED ORDER — OXYCODONE HCL 5 MG/5ML PO SOLN: 5.0000 mg | Freq: Once | ORAL | Status: DC | PRN

## 2022-04-10 MED ORDER — METHOCARBAMOL 500 MG PO TABS: 500.0000 mg | ORAL_TABLET | Freq: Four times a day (QID) | ORAL | Status: DC | PRN

## 2022-04-10 MED ORDER — FENTANYL CITRATE (PF) 100 MCG/2ML IJ SOLN: 25.0000 ug | INTRAMUSCULAR | Status: DC | PRN

## 2022-04-10 MED ORDER — ONDANSETRON 4 MG PO TBDP: 4.0000 mg | ORAL_TABLET | Freq: Four times a day (QID) | ORAL | Status: DC | PRN

## 2022-04-10 MED ORDER — OXYCODONE HCL 5 MG PO TABS: 5.0000 mg | ORAL_TABLET | Freq: Once | ORAL | Status: DC | PRN

## 2022-04-10 MED ORDER — DIPHENHYDRAMINE HCL 12.5 MG/5ML PO ELIX: 12.5000 mg | ORAL_SOLUTION | Freq: Four times a day (QID) | ORAL | Status: DC | PRN

## 2022-04-10 MED ORDER — ACETAMINOPHEN 500 MG PO TABS
1000.0000 mg | ORAL_TABLET | Freq: Four times a day (QID) | ORAL | Status: DC
  Administered 2022-04-10 – 2022-04-11 (×4): 1000 mg via ORAL
  Filled 2022-04-10 (×3): qty 2

## 2022-04-10 MED ORDER — LIDOCAINE 2% (20 MG/ML) 5 ML SYRINGE
INTRAMUSCULAR | Status: AC
  Filled 2022-04-10: qty 5

## 2022-04-10 MED ORDER — ONDANSETRON HCL 4 MG/2ML IJ SOLN: 4.0000 mg | Freq: Four times a day (QID) | INTRAMUSCULAR | Status: DC | PRN

## 2022-04-10 MED ORDER — KETOROLAC TROMETHAMINE 30 MG/ML IJ SOLN
INTRAMUSCULAR | Status: AC
  Filled 2022-04-10: qty 1

## 2022-04-10 MED ORDER — DEXAMETHASONE SODIUM PHOSPHATE 4 MG/ML IJ SOLN
INTRAMUSCULAR | Status: DC | PRN
  Administered 2022-04-10: 5 mg via INTRAVENOUS

## 2022-04-10 MED ORDER — ACETAMINOPHEN 500 MG PO TABS
ORAL_TABLET | ORAL | Status: AC
  Filled 2022-04-10: qty 2

## 2022-04-10 MED ORDER — FENTANYL CITRATE (PF) 100 MCG/2ML IJ SOLN
INTRAMUSCULAR | Status: AC
  Filled 2022-04-10: qty 2

## 2022-04-10 SURGICAL SUPPLY — 42 items
ADH SKN CLS APL DERMABOND .7 (GAUZE/BANDAGES/DRESSINGS) ×2
APL PRP STRL LF DISP 70% ISPRP (MISCELLANEOUS) ×1
BLADE CLIPPER SURG (BLADE) IMPLANT
BLADE SURG 15 STRL LF DISP TIS (BLADE) ×1 IMPLANT
BLADE SURG 15 STRL SS (BLADE) ×1
CANISTER SUCT 1200ML W/VALVE (MISCELLANEOUS) IMPLANT
CHLORAPREP W/TINT 26 (MISCELLANEOUS) ×1 IMPLANT
COVER BACK TABLE 60X90IN (DRAPES) ×1 IMPLANT
COVER MAYO STAND STRL (DRAPES) ×1 IMPLANT
DERMABOND ADVANCED (GAUZE/BANDAGES/DRESSINGS) ×2
DERMABOND ADVANCED .7 DNX12 (GAUZE/BANDAGES/DRESSINGS) ×2 IMPLANT
DRAPE LAPAROTOMY 100X72 PEDS (DRAPES) ×1 IMPLANT
DRAPE UTILITY XL STRL (DRAPES) ×1 IMPLANT
DRSG TEGADERM 2-3/8X2-3/4 SM (GAUZE/BANDAGES/DRESSINGS) IMPLANT
ELECT REM PT RETURN 9FT ADLT (ELECTROSURGICAL) ×1
ELECTRODE REM PT RTRN 9FT ADLT (ELECTROSURGICAL) ×1 IMPLANT
GLOVE SURG SIGNA 7.5 PF LTX (GLOVE) ×1 IMPLANT
GOWN STRL REUS W/ TWL LRG LVL3 (GOWN DISPOSABLE) ×1 IMPLANT
GOWN STRL REUS W/ TWL XL LVL3 (GOWN DISPOSABLE) ×1 IMPLANT
GOWN STRL REUS W/TWL LRG LVL3 (GOWN DISPOSABLE) ×1
GOWN STRL REUS W/TWL XL LVL3 (GOWN DISPOSABLE) ×1
NDL HYPO 25X1 1.5 SAFETY (NEEDLE) ×1 IMPLANT
NEEDLE HYPO 25X1 1.5 SAFETY (NEEDLE) ×1 IMPLANT
NS IRRIG 1000ML POUR BTL (IV SOLUTION) IMPLANT
PACK BASIN DAY SURGERY FS (CUSTOM PROCEDURE TRAY) ×1 IMPLANT
PENCIL SMOKE EVACUATOR (MISCELLANEOUS) ×1 IMPLANT
SLEEVE SCD COMPRESS KNEE MED (STOCKING) ×1 IMPLANT
SPIKE FLUID TRANSFER (MISCELLANEOUS) IMPLANT
SPONGE T-LAP 4X18 ~~LOC~~+RFID (SPONGE) IMPLANT
SUT ETHIBOND NAB CT1 #1 30IN (SUTURE) IMPLANT
SUT MNCRL AB 4-0 PS2 18 (SUTURE) ×1 IMPLANT
SUT NOVA 0 T19/GS 22DT (SUTURE) IMPLANT
SUT NOVA NAB DX-16 0-1 5-0 T12 (SUTURE) IMPLANT
SUT NOVA NAB GS-21 1 T12 (SUTURE) IMPLANT
SUT VIC AB 2-0 SH 27 (SUTURE)
SUT VIC AB 2-0 SH 27XBRD (SUTURE) IMPLANT
SUT VIC AB 3-0 SH 27 (SUTURE) ×1
SUT VIC AB 3-0 SH 27X BRD (SUTURE) ×1 IMPLANT
SYR CONTROL 10ML LL (SYRINGE) ×1 IMPLANT
TOWEL GREEN STERILE FF (TOWEL DISPOSABLE) ×1 IMPLANT
TUBE CONNECTING 20X1/4 (TUBING) IMPLANT
YANKAUER SUCT BULB TIP NO VENT (SUCTIONS) IMPLANT

## 2022-04-10 NOTE — Op Note (Signed)
   Jordan Nelson 04/10/2022   Pre-op Diagnosis: INCARCERATED UMBILICAL HERNIA     Post-op Diagnosis: same  Procedure(s): OPEN UMBILICAL HERNIA REPAIR  1 CM FASCIAL DEFECT  Surgeon(s): [name], MD  Anesthesia: General  Staff:  Circulator: Raliegh Scarlet, RN; Clide Cliff, RN Scrub Person: Maryan Rued, RN  Estimated Blood Loss: Minimal               Findings: The patient was found to have an incarcerated hernia above the umbilicus containing omentum. The fascial defect was 1 cm in size The hernia was repaired primarily  Procedure: The patient was brought to the operating room and identifies the correct patient.  He is placed upon the operating room table and general anesthesia was induced.  His abdomen was then prepped and draped in usual sterile fashion.  I anesthetized the skin over the palpable incarcerated hernia with Marcaine.  I then made a longitudinal incision with a scalpel.  I then dissected down to the hernia sac which was easily identified.  I dissected down to the base as it exited the fascia.  I transected the sac which was found to contain only omentum at its base and reduced the rest of the omentum back into the abdominal cavity.  The fascial defect itself was only about a centimeter in size.  The decision was made to forego placement of mesh.  I closed the fascial defect with 2 separate figure-of-eight #1 Ethibond sutures.  The fascia was reapproximated easily.  I anesthetized the surrounding area with more Marcaine.  I then closed the subcutaneous tissue with interrupted 3-0 Vicryl sutures and closed the skin with a running 4-0 Monocryl.  Dermabond was then applied.  The patient tolerated the procedure well.  All the counts were correct at the end of the procedure.  The patient was then extubated in the operating room and taken in a stable condition to the recovery room.          [name]   Date: 04/10/2022  Time: 9:15 AM

## 2022-04-10 NOTE — Transfer of Care (Signed)
Immediate Anesthesia Transfer of Care Note  Patient: Jordan Nelson  Procedure(s) Performed: OPEN UMBILICAL HERNIA REPAIR WITH MESH (Abdomen)  Patient Location: PACU  Anesthesia Type:General  Level of Consciousness: awake, alert  and oriented  Airway & Oxygen Therapy: Patient Spontanous Breathing and Patient connected to face mask oxygen  Post-op Assessment: Report given to RN and Post -op Vital signs reviewed and stable  Post vital signs: Reviewed and stable  Last Vitals:  Vitals Value Taken Time  BP 115/68 04/10/22 0921  Temp    Pulse 71 04/10/22 0929  Resp 16 04/10/22 0929  SpO2 100 % 04/10/22 0929  Vitals shown include unvalidated device data.  Last Pain:  Vitals:   04/10/22 0641  TempSrc: Oral  PainSc: 3       Patients Stated Pain Goal: 5 (04/10/22 0641)  Complications: No notable events documented.

## 2022-04-10 NOTE — Interval H&P Note (Signed)
History and Physical Interval Note:no change in H and P  04/10/2022 7:15 AM  Jordan Nelson  has presented today for surgery, with the diagnosis of INCARCERATED UMBILICAL HERNIA.  The various methods of treatment have been discussed with the patient and family. After consideration of risks, benefits and other options for treatment, the patient has consented to  Procedure(s): OPEN UMBILICAL HERNIA REPAIR WITH MESH (N/A) as a surgical intervention.  The patient's history has been reviewed, patient examined, no change in status, stable for surgery.  I have reviewed the patient's chart and labs.  Questions were answered to the patient's satisfaction.     [name]

## 2022-04-10 NOTE — Discharge Instructions (Signed)
  Post Anesthesia Home Care Instructions  Activity: Get plenty of rest for the remainder of the day. A responsible individual must stay with you for 24 hours following the procedure.  For the next 24 hours, DO NOT: -Drive a car -Paediatric nurse -Drink alcoholic beverages -Take any medication unless instructed by your physician -Make any legal decisions or sign important papers.  Meals: Start with liquid foods such as gelatin or soup. Progress to regular foods as tolerated. Avoid greasy, spicy, heavy foods. If nausea and/or vomiting occur, drink only clear liquids until the nausea and/or vomiting subsides. Call your physician if vomiting continues.  Special Instructions/Symptoms: Your throat may feel dry or sore from the anesthesia or the breathing tube placed in your throat during surgery. If this causes discomfort, gargle with warm salt water. The discomfort should disappear within 24 hours.  If you had a scopolamine patch placed behind your ear for the management of post- operative nausea and/or vomiting:  1. The medication in the patch is effective for 72 hours, after which it should be removed.  Wrap patch in a tissue and discard in the trash. Wash hands thoroughly with soap and water. 2. You may remove the patch earlier than 72 hours if you experience unpleasant side effects which may include dry mouth, dizziness or visual disturbances. 3. Avoid touching the patch. Wash your hands with soap and water after contact with the patch.

## 2022-04-10 NOTE — Anesthesia Procedure Notes (Signed)
Procedure Name: LMA Insertion Date/Time: 04/10/2022 8:44 AM  Performed by: Cleda Clarks, CRNAPre-anesthesia Checklist: Patient identified, Emergency Drugs available, Suction available and Patient being monitored Patient Re-evaluated:Patient Re-evaluated prior to induction Oxygen Delivery Method: Circle system utilized Preoxygenation: Pre-oxygenation with 100% oxygen Induction Type: IV induction Ventilation: Mask ventilation without difficulty LMA: LMA inserted LMA Size: 4.0 Number of attempts: 1 Placement Confirmation: positive ETCO2 Tube secured with: Tape Dental Injury: Teeth and Oropharynx as per pre-operative assessment

## 2022-04-10 NOTE — Anesthesia Postprocedure Evaluation (Signed)
Anesthesia Post Note  Patient: Jordan Nelson  Procedure(s) Performed: OPEN UMBILICAL HERNIA REPAIR WITH MESH (Abdomen)     Patient location during evaluation: PACU Anesthesia Type: General Level of consciousness: awake and alert Pain management: pain level controlled Vital Signs Assessment: post-procedure vital signs reviewed and stable Respiratory status: spontaneous breathing, nonlabored ventilation, respiratory function stable and patient connected to nasal cannula oxygen Cardiovascular status: blood pressure returned to baseline and stable Postop Assessment: no apparent nausea or vomiting Anesthetic complications: no   No notable events documented.  Last Vitals:  Vitals:   04/10/22 0930 04/10/22 0945  BP: 112/71 122/80  Pulse: 64 65  Resp: 12 17  Temp:    SpO2: 100% 100%    Last Pain:  Vitals:   04/10/22 1000  TempSrc:   PainSc: 0-No pain                 Arianie Couse

## 2022-04-11 DIAGNOSIS — K420 Umbilical hernia with obstruction, without gangrene: Secondary | ICD-10-CM | POA: Diagnosis not present

## 2022-04-11 DIAGNOSIS — M1990 Unspecified osteoarthritis, unspecified site: Secondary | ICD-10-CM | POA: Diagnosis not present

## 2022-04-11 DIAGNOSIS — Z87891 Personal history of nicotine dependence: Secondary | ICD-10-CM | POA: Diagnosis not present

## 2022-04-11 MED ORDER — TRAMADOL HCL 50 MG PO TABS: 50.0000 mg | ORAL_TABLET | Freq: Four times a day (QID) | ORAL | 0 refills | Status: DC | PRN

## 2022-04-11 NOTE — Discharge Summary (Signed)
Physician Discharge Summary  Patient ID: Jordan Nelson MRN: [id number] DOB/AGE: [birth year] 71 y.o.  Admit date: 04/10/2022 Discharge date: 04/11/2022  Admission Diagnoses:  Discharge Diagnoses:  Principal Problem:   Incarcerated ventral hernia   Discharged Condition: good  Hospital Course: UNEVENTFUL POST OP RECOVERY.  DISCHARGED HOME POD#1 DOING WELL  Consults: None  Significant Diagnostic Studies:   Treatments: surgery: open umbilical hernia repair with mesh  Discharge Exam: Blood pressure 108/69, pulse (!) 54, temperature 98.1 F (36.7 C), resp. rate 16, height 5\' 11"  (1.803 m), weight 73 kg, SpO2 100 %. General appearance: alert, cooperative, and no distress Resp: clear to auscultation bilaterally Cardio: regular rate and rhythm, S1, S2 normal, no murmur, click, rub or gallop Incision/Wound:abdomen soft, minimally tender, incision clean  Disposition: Discharge disposition: 01-Home or Self Care        Allergies as of 04/11/2022   No Known Allergies      Medication List     TAKE these medications    DULoxetine 30 MG capsule Commonly known as: CYMBALTA Take 1 capsule by mouth daily.   finasteride 5 MG tablet Commonly known as: PROSCAR Take 1 tablet by mouth daily.   Ipratropium-Albuterol 20-100 MCG/ACT Aers respimat Commonly known as: COMBIVENT INHALE 1 PUFF BY MOUTH FOUR TIMES A DAY AS NEEDED - USE CONTINUOUSLY FOR 2 WEEKS THEN AS NEEDED FOR SHORTNESS OF BREATH   lidocaine 5 % Commonly known as: LIDODERM APPLY 1 PATCH TO SKIN ONCE DAILY (APPLY FOR 12 HOURS, THEN REMOVE FOR 12 HOURS)   meloxicam 15 MG tablet Commonly known as: MOBIC Take by mouth.   METAMUCIL FIBER PO Take by mouth.   tamsulosin 0.4 MG Caps capsule Commonly known as: FLOMAX Take 1 capsule by mouth at bedtime.   Tiotropium Bromide Monohydrate 1.25 MCG/ACT Aers INHALE 2 PUFFS BY MOUTH ONCE A DAY   tiZANidine 4 MG tablet Commonly known as: Zanaflex Take 1-2 tablets (4-8  mg total) by mouth every 6 (six) hours as needed for muscle spasms.   traMADol 50 MG tablet Commonly known as: ULTRAM Take 1 tablet (50 mg total) by mouth every 6 (six) hours as needed.        Follow-up Information     04/13/2022, MD Follow up in 4 week(s).   Specialty: General Surgery Contact information: [address] [place] [phone]                 Signed: [phone] 04/11/2022, 6:40 AM

## 2022-04-12 ENCOUNTER — Encounter (HOSPITAL_BASED_OUTPATIENT_CLINIC_OR_DEPARTMENT_OTHER): Payer: Self-pay | Admitting: Surgery

## 2022-05-07 DIAGNOSIS — K420 Umbilical hernia with obstruction, without gangrene: Secondary | ICD-10-CM | POA: Diagnosis not present

## 2022-06-19 ENCOUNTER — Telehealth: Payer: Self-pay

## 2022-06-19 NOTE — Telephone Encounter (Signed)
Pt called requesting information on leg elevators.  Pt called, two identifiers used. Answered questions on different types of wedges for proper leg elevation and help with sciatic nerve pain. Gave discount code. Confirmed understanding.

## 2022-06-29 ENCOUNTER — Ambulatory Visit (HOSPITAL_COMMUNITY)
Admission: EM | Admit: 2022-06-29 | Discharge: 2022-06-29 | Disposition: A | Discharge status: Home / Self Care | Payer: No Typology Code available for payment source

## 2022-06-29 ENCOUNTER — Encounter (HOSPITAL_COMMUNITY): Payer: Self-pay | Admitting: *Deleted

## 2022-06-29 ENCOUNTER — Other Ambulatory Visit: Payer: Self-pay

## 2022-06-29 DIAGNOSIS — L905 Scar conditions and fibrosis of skin: Secondary | ICD-10-CM

## 2022-06-29 DIAGNOSIS — R109 Unspecified abdominal pain: Secondary | ICD-10-CM | POA: Diagnosis not present

## 2022-06-29 DIAGNOSIS — R239 Unspecified skin changes: Secondary | ICD-10-CM

## 2022-06-29 NOTE — Discharge Instructions (Addendum)
[facility] They can refer you to see dermatology if needed.  Monitor your symptoms.  Watch for: severe abdominal pain, vomiting that doesn't stop, inability to have a bowel movement, high fever (over 104 degrees), or blood in stool or vomit. Go to the emergency department if any of these occur.

## 2022-06-29 NOTE — ED Provider Notes (Signed)
[facility]    CSN: [id number] Arrival date & time: 06/29/22  1608      History   Chief Complaint Chief Complaint  Patient presents with   lumps in axilla   Lump on rt side of jaw   Abdominal Pain    HPI Jordan Nelson is a 71 y.o. male.  Presents with a few concerns today  On and off lumps in the armpit for a few months Maybe increasing over the last few weeks They are non tender but itch sometimes History of sweat gland surgery many years ago  Small swelling area of the right jaw, only notices it every now and then Not painful but sometimes irritating Denies any pain in the throat or trouble swallowing No recent illness    Past Medical History:  Diagnosis Date   Arthritis    Benign enlargement of prostate    Chronic back pain    Epigastric hernia    GERD (gastroesophageal reflux disease)    Numbness    right hand   Pollen allergy    Scoliosis    " minor"   Tuberculosis    early 1980's   Venous insufficiency (chronic) (peripheral)    Wears glasses     Patient Active Problem List   Diagnosis Date Noted   Incarcerated ventral hernia 04/10/2022   Anemia, unspecified 06/14/2021   Corn of foot 06/14/2021   Carpal tunnel syndrome 06/14/2021   Epistaxis 06/14/2021   Right sided sciatica 06/14/2021   Age-related nuclear cataract, bilateral 06/14/2021   Benign prostatic hyperplasia with lower urinary tract symptoms 06/14/2021   Centrilobular emphysema (HCC) 06/14/2021   Decreased white blood cell count, unspecified 06/14/2021   Edema, unspecified 06/14/2021   Low back pain, unspecified 06/14/2021   Other specified counseling 06/14/2021   Sebaceous cyst 06/14/2021   Varicose veins of unspecified lower extremity with inflammation 06/14/2021   Venous insufficiency (chronic) (peripheral) 06/14/2021   Elevated blood-pressure reading, without diagnosis of hypertension 10/17/2020   Tinea pedis, recurrent 12/24/2019   Onychomycosis of toenail  12/24/2019   Scoliosis of lumbar spine 12/24/2019   Degenerative joint disease (DJD) of lumbar spine 12/24/2019    Past Surgical History:  Procedure Laterality Date   EYE SURGERY     HERNIA REPAIR     UMBILICAL HERNIA REPAIR N/A 04/10/2022   Procedure: OPEN UMBILICAL HERNIA REPAIR WITH MESH;  Surgeon: [name], MD;  Location: [facility];  Service: General;  Laterality: N/A;       Home Medications    Prior to Admission medications   Medication Sig Start Date End Date Taking? Authorizing Provider  DULoxetine (CYMBALTA) 30 MG capsule Take 1 capsule by mouth daily. 10/27/20   [provider]  finasteride (PROSCAR) 5 MG tablet Take 1 tablet by mouth daily. 07/25/20   [provider]  Ipratropium-Albuterol (COMBIVENT) 20-100 MCG/ACT AERS respimat INHALE 1 PUFF BY MOUTH FOUR TIMES A DAY AS NEEDED - USE CONTINUOUSLY FOR 2 WEEKS THEN AS NEEDED FOR SHORTNESS OF BREATH 07/25/20   [provider]  lidocaine (LIDODERM) 5 % APPLY 1 PATCH TO SKIN ONCE DAILY (APPLY FOR 12 HOURS, THEN REMOVE FOR 12 HOURS) 09/14/20   [provider]  meloxicam (MOBIC) 15 MG tablet Take by mouth. 01/03/20   [provider]  METAMUCIL FIBER PO Take by mouth.    [provider]  tamsulosin (FLOMAX) 0.4 MG CAPS capsule Take 1 capsule by mouth at bedtime. 01/16/21   [provider]  Tiotropium Bromide Monohydrate 1.25 MCG/ACT AERS INHALE 2 PUFFS BY MOUTH ONCE A DAY 09/24/20   [provider]  tiZANidine (ZANAFLEX) 4 MG tablet Take 1-2 tablets (4-8 mg total) by mouth every 6 (six) hours as needed for muscle spasms. 12/24/19   Eustace Moore, MD  traMADol (ULTRAM) 50 MG tablet Take 1 tablet (50 mg total) by mouth every 6 (six) hours as needed. 04/11/22   [name], MD    Family History Family History  Problem Relation Age of Onset   Hypertension Mother     Social History Social History   Tobacco Use   Smoking status:  Former    Types: Cigarettes    Passive exposure: Never   Smokeless tobacco: Never  Vaping Use   Vaping Use: Never used  Substance Use Topics   Alcohol use: No   Drug use: No     Allergies   Patient has no known allergies.   Review of Systems Review of Systems  Gastrointestinal:  Positive for abdominal pain.     Physical Exam Triage Vital Signs ED Triage Vitals  Enc Vitals Group     BP 06/29/22 1757 136/86     Pulse Rate 06/29/22 1757 65     Resp 06/29/22 1757 20     Temp 06/29/22 1757 98.1 F (36.7 C)     Temp src --      SpO2 06/29/22 1757 98 %     Weight --      Height --      Head Circumference --      Peak Flow --      Pain Score 06/29/22 1754 9     Pain Loc --      Pain Edu? --      Excl. in GC? --    No data found.  Updated Vital Signs BP 136/86   Pulse 65   Temp 98.1 F (36.7 C)   Resp 20   SpO2 98%   Visual Acuity Right Eye Distance:   Left Eye Distance:   Bilateral Distance:    Right Eye Near:   Left Eye Near:    Bilateral Near:     Physical Exam   UC Treatments / Results  Labs (all labs ordered are listed, but only abnormal results are displayed) Labs Reviewed - No data to display  EKG   Radiology No results found.  Procedures Procedures (including critical care time)  Medications Ordered in UC Medications - No data to display  Initial Impression / Assessment and Plan / UC Course  I have reviewed the triage vital signs and the nursing notes.  Pertinent labs & imaging results that were available during my care of the patient were reviewed by me and considered in my medical decision making (see chart for details).     *** Final Clinical Impressions(s) / UC Diagnoses   Final diagnoses:  Unspecified skin changes  Scar tissue  Abdominal discomfort     Discharge Instructions      [facility] They can refer you to see dermatology if needed.  Monitor your symptoms.   Watch for: severe abdominal pain, vomiting that doesn't stop, inability to have a bowel movement, high fever (over 104 degrees), or blood in stool or vomit. Go to the emergency department if any of these occur.    ED Prescriptions   None    PDMP not reviewed this encounter.

## 2022-06-29 NOTE — ED Triage Notes (Signed)
Pt reports he has more than one lump located inhis Lt axilla. Pt also reports lump on RT side of jaw . He also has ABD cramping .

## 2022-07-12 DIAGNOSIS — K420 Umbilical hernia with obstruction, without gangrene: Secondary | ICD-10-CM | POA: Diagnosis not present

## 2023-06-06 DIAGNOSIS — L732 Hidradenitis suppurativa: Secondary | ICD-10-CM | POA: Diagnosis not present

## 2023-06-28 ENCOUNTER — Encounter (HOSPITAL_COMMUNITY): Payer: Self-pay | Admitting: Emergency Medicine

## 2023-06-28 ENCOUNTER — Other Ambulatory Visit: Payer: Self-pay

## 2023-06-28 ENCOUNTER — Emergency Department (HOSPITAL_COMMUNITY)
Admission: EM | Admit: 2023-06-28 | Discharge: 2023-06-29 | Disposition: A | Discharge status: Home / Self Care | Payer: No Typology Code available for payment source | Attending: Emergency Medicine | Admitting: Emergency Medicine

## 2023-06-28 DIAGNOSIS — M79672 Pain in left foot: Secondary | ICD-10-CM | POA: Insufficient documentation

## 2023-06-28 DIAGNOSIS — R202 Paresthesia of skin: Secondary | ICD-10-CM | POA: Diagnosis present

## 2023-06-28 LAB — URINALYSIS, ROUTINE W REFLEX MICROSCOPIC
Bilirubin Urine: NEGATIVE
Glucose, UA: NEGATIVE mg/dL
Hgb urine dipstick: NEGATIVE
Ketones, ur: NEGATIVE mg/dL
Leukocytes,Ua: NEGATIVE
Nitrite: NEGATIVE
Protein, ur: NEGATIVE mg/dL
Specific Gravity, Urine: 1.023 (ref 1.005–1.030)
pH: 5 (ref 5.0–8.0)

## 2023-06-28 LAB — CBG MONITORING, ED: Glucose-Capillary: 88 mg/dL (ref 70–99)

## 2023-06-28 NOTE — ED Triage Notes (Signed)
Pt here for L foot pain since Thursday, states that he feels pins and needles in his feet when he is taking a shower. Reports hx of pre-diabetes.

## 2023-06-29 LAB — CBC
HCT: 30.4 % — ABNORMAL LOW (ref 39.0–52.0)
Hemoglobin: 9.8 g/dL — ABNORMAL LOW (ref 13.0–17.0)
MCH: 31.6 pg (ref 26.0–34.0)
MCHC: 32.2 g/dL (ref 30.0–36.0)
MCV: 98.1 fL (ref 80.0–100.0)
Platelets: 136 10*3/uL — ABNORMAL LOW (ref 150–400)
RBC: 3.1 MIL/uL — ABNORMAL LOW (ref 4.22–5.81)
RDW: 12.1 % (ref 11.5–15.5)
WBC: 2.6 10*3/uL — ABNORMAL LOW (ref 4.0–10.5)
nRBC: 0 % (ref 0.0–0.2)

## 2023-06-29 LAB — BASIC METABOLIC PANEL
Anion gap: 6 (ref 5–15)
BUN: 18 mg/dL (ref 8–23)
CO2: 26 mmol/L (ref 22–32)
Calcium: 9 mg/dL (ref 8.9–10.3)
Chloride: 106 mmol/L (ref 98–111)
Creatinine, Ser: 1.22 mg/dL (ref 0.61–1.24)
GFR, Estimated: >60 mL/min (ref >60)
Glucose, Bld: 85 mg/dL (ref 70–99)
Potassium: 3.6 mmol/L (ref 3.5–5.1)
Sodium: 138 mmol/L (ref 135–145)

## 2023-06-29 MED ORDER — GABAPENTIN 100 MG PO CAPS
100.0000 mg | ORAL_CAPSULE | Freq: Once | ORAL | Status: AC
  Administered 2023-06-29: 100 mg via ORAL
  Filled 2023-06-29: qty 1

## 2023-06-29 MED ORDER — GABAPENTIN 100 MG PO CAPS: 100.0000 mg | ORAL_CAPSULE | Freq: Three times a day (TID) | ORAL | 0 refills | Status: DC

## 2023-06-29 NOTE — ED Provider Notes (Signed)
[place] EMERGENCY DEPARTMENT AT [facility] Provider Note   CSN: [id number] Arrival date & time: 06/28/23  2149     History  Chief Complaint  Patient presents with   Foot Pain    Jordan Nelson is a 72 y.o. male.  The history is provided by the patient and medical records.  Foot Pain   72 y.o. M presenting to the ED for pins/needles sensation in left foot.  This has been ongoing since Thursday, usually only happening when he takes a shower in hot water.  He is not having any significant pain, no focal numbness/weakness.  No gait disturbance or difficulty walking around.  Denies any back pain.  No bowel or bladder incontinence. He is pre-diabetic, not currently on any medications but just watching diet.  Home Medications Prior to Admission medications   Medication Sig Start Date End Date Taking? Authorizing Provider  gabapentin (NEURONTIN) 100 MG capsule Take 1 capsule (100 mg total) by mouth 3 (three) times daily. 06/29/23  Yes Garlon Hatchet, [name]  DULoxetine (CYMBALTA) 30 MG capsule Take 1 capsule by mouth daily. 10/27/20   [provider]  finasteride (PROSCAR) 5 MG tablet Take 1 tablet by mouth daily. 07/25/20   [provider]  Ipratropium-Albuterol (COMBIVENT) 20-100 MCG/ACT AERS respimat INHALE 1 PUFF BY MOUTH FOUR TIMES A DAY AS NEEDED - USE CONTINUOUSLY FOR 2 WEEKS THEN AS NEEDED FOR SHORTNESS OF BREATH 07/25/20   [provider]  lidocaine (LIDODERM) 5 % APPLY 1 PATCH TO SKIN ONCE DAILY (APPLY FOR 12 HOURS, THEN REMOVE FOR 12 HOURS) 09/14/20   [provider]  meloxicam (MOBIC) 15 MG tablet Take by mouth. 01/03/20   [provider]  METAMUCIL FIBER PO Take by mouth.    [provider]  tamsulosin (FLOMAX) 0.4 MG CAPS capsule Take 1 capsule by mouth at bedtime. 01/16/21   [provider]  Tiotropium Bromide Monohydrate 1.25 MCG/ACT AERS INHALE 2 PUFFS BY MOUTH ONCE A DAY 09/24/20   [provider]  tiZANidine (ZANAFLEX) 4 MG tablet Take 1-2 tablets (4-8 mg total) by mouth every 6 (six) hours as needed for muscle spasms. 12/24/19   Eustace Moore, MD  traMADol (ULTRAM) 50 MG tablet Take 1 tablet (50 mg total) by mouth every 6 (six) hours as needed. 04/11/22   [name], MD      Allergies    Patient has no known allergies.    Review of Systems   Review of Systems  Musculoskeletal:        Tingling left foot   All other systems reviewed and are negative.   Physical Exam Updated Vital Signs BP (!) 136/92 (BP Location: Right Arm)   Pulse 62   Temp 97.6 F (36.4 C) (Oral)   Resp 16   Ht 5\' 11"  (1.803 m)   Wt 72.6 kg   SpO2 100%   BMI 22.32 kg/m   Physical Exam Vitals and nursing note reviewed.  Constitutional:      Appearance: He is well-developed.  HENT:     Head: Normocephalic and atraumatic.  Eyes:     Conjunctiva/sclera: Conjunctivae normal.     Pupils: Pupils are equal, round, and reactive to light.  Cardiovascular:     Rate and Rhythm: Normal rate and regular rhythm.     Heart sounds: Normal heart sounds.  Pulmonary:     Effort: Pulmonary effort is normal.     Breath sounds: Normal breath sounds.  Abdominal:  General: Bowel sounds are normal.     Palpations: Abdomen is soft.  Musculoskeletal:        General: Normal range of motion.     Cervical back: Normal range of motion.  Feet:     Comments: Compression stockings in place-- taken down Left foot without open wound/sores, no skin breakdown between toes, normal sensation, DP pulse intact, wigging toes on command, ambulatory with steady gait Skin:    General: Skin is warm and dry.  Neurological:     Mental Status: He is alert and oriented to person, place, and time.     ED Results / Procedures / Treatments   Labs (all labs ordered are listed, but only abnormal results are displayed) Labs Reviewed  CBC - Abnormal; Notable for the following components:      Result Value   WBC 2.6  (*)    RBC 3.10 (*)    Hemoglobin 9.8 (*)    HCT 30.4 (*)    Platelets 136 (*)    All other components within normal limits  BASIC METABOLIC PANEL  URINALYSIS, ROUTINE W REFLEX MICROSCOPIC  CBG MONITORING, ED    EKG None  Radiology No results found.  Procedures Procedures    Medications Ordered in ED Medications  gabapentin (NEURONTIN) capsule 100 mg (100 mg Oral Given 06/29/23 0130)    ED Course/ Medical Decision Making/ A&P                                 Medical Decision Making Amount and/or Complexity of Data Reviewed Labs: ordered. ECG/medicine tests: ordered and independent interpretation performed.  Risk Prescription drug management.   72 year old male presenting to the ED with pins and needle sensation of his left foot.  This has been ongoing since Thursday but usually present when taking a hot shower.  He does not have any focal numbness or weakness on exam.  He is not having any back pain.  No bowel or bladder incontinence.  Compression stockings were removed, he does not have any open wounds or sores to the feet.  He maintains normal sensation on my exam.  He is ambulatory to treatment room from the lobby without difficulty.  Labs were obtained--appear at his baseline.  No significant abnormalities.  Do not feel this is central cause such as TIA/CVA.  May be due to neuropathy as he is prediabetic.  Will give trial of gabapentin to see if this helps, starting low dose for now given his age.  Encouraged to follow-up closely with PCP-- can continue gabapentin if helping and titrate up which I discussed with him.  Return here for new concerns.  Final Clinical Impression(s) / ED Diagnoses Final diagnoses:  Tingling sensation    Rx / DC Orders ED Discharge Orders          Ordered    gabapentin (NEURONTIN) 100 MG capsule  3 times daily        06/29/23 0100              Garlon Hatchet, [name] 06/29/23 1308    Zadie Rhine, MD 06/29/23 [id number]

## 2023-06-29 NOTE — Discharge Instructions (Signed)
Take the prescribed medication as directed. °Follow-up with your primary care doctor. °Return to the ED for new or worsening symptoms. °

## 2023-07-21 ENCOUNTER — Telehealth: Payer: Self-pay | Admitting: *Deleted

## 2023-07-21 NOTE — Telephone Encounter (Signed)
Transition Care Management Unsuccessful Follow-up Telephone Call  Date of discharge and from where:  [facility] [place]  06/29/2023  Attempts:  1st Attempt  Reason for unsuccessful TCM follow-up call:  No answer/busy

## 2023-07-23 ENCOUNTER — Telehealth: Payer: Self-pay | Admitting: *Deleted

## 2023-07-23 NOTE — Telephone Encounter (Signed)
Transition Care Management Unsuccessful Follow-up Telephone Call  Date of discharge and from where:  [facility] [place]  06/29/2023  Attempts:  2nd Attempt  Reason for unsuccessful TCM follow-up call:  No answer/busy

## 2023-08-06 ENCOUNTER — Emergency Department (HOSPITAL_COMMUNITY): Payer: No Typology Code available for payment source

## 2023-08-06 ENCOUNTER — Emergency Department (HOSPITAL_BASED_OUTPATIENT_CLINIC_OR_DEPARTMENT_OTHER): Payer: No Typology Code available for payment source

## 2023-08-06 ENCOUNTER — Encounter (HOSPITAL_COMMUNITY): Payer: Self-pay

## 2023-08-06 ENCOUNTER — Other Ambulatory Visit: Payer: Self-pay

## 2023-08-06 ENCOUNTER — Emergency Department (HOSPITAL_COMMUNITY)
Admission: EM | Admit: 2023-08-06 | Discharge: 2023-08-06 | Disposition: A | Discharge status: Home / Self Care | Payer: No Typology Code available for payment source | Attending: Student | Admitting: Student

## 2023-08-06 DIAGNOSIS — Z87891 Personal history of nicotine dependence: Secondary | ICD-10-CM | POA: Diagnosis not present

## 2023-08-06 DIAGNOSIS — R600 Localized edema: Secondary | ICD-10-CM | POA: Diagnosis not present

## 2023-08-06 DIAGNOSIS — M79605 Pain in left leg: Secondary | ICD-10-CM | POA: Diagnosis present

## 2023-08-06 DIAGNOSIS — M7989 Other specified soft tissue disorders: Secondary | ICD-10-CM | POA: Diagnosis not present

## 2023-08-06 LAB — D-DIMER, QUANTITATIVE: D-Dimer, Quant: 0.54 ug{FEU}/mL — ABNORMAL HIGH (ref 0.00–0.50)

## 2023-08-06 MED ORDER — FUROSEMIDE 20 MG PO TABS
20.0000 mg | ORAL_TABLET | Freq: Once | ORAL | Status: AC
  Administered 2023-08-06: 20 mg via ORAL
  Filled 2023-08-06: qty 1

## 2023-08-06 MED ORDER — GABAPENTIN 300 MG PO CAPS
300.0000 mg | ORAL_CAPSULE | Freq: Once | ORAL | Status: AC
  Administered 2023-08-06: 300 mg via ORAL
  Filled 2023-08-06: qty 1

## 2023-08-06 NOTE — ED Notes (Signed)
X-ray at bedside

## 2023-08-06 NOTE — ED Triage Notes (Signed)
Patient reports increasing pain at left lower leg with mild swelling  onset last Sunday , denies injury /ambulatory .

## 2023-08-06 NOTE — ED Notes (Signed)
Pt ambulated to the bathroom.  

## 2023-08-06 NOTE — Progress Notes (Signed)
Lower extremity venous duplex completed. Please see CV Procedures for preliminary results.  Shona Simpson, RVT 08/06/23 9:05 AM

## 2023-08-06 NOTE — ED Provider Notes (Signed)
Formal ultrasound obtained at the request of overnight physician, negative for DVT based on preliminary results, this is consistent with the patient's negative D-dimer based on age-adjusted rules.  Vital signs are unremarkable, the patient appears stable for discharge   Eber Hong, MD 08/06/23 1029

## 2023-08-06 NOTE — ED Provider Notes (Signed)
[place] EMERGENCY DEPARTMENT AT [facility] Provider Note  CSN: [id number] Arrival date & time: 08/06/23 0250  Chief Complaint(s) Left Lower Leg Pain   HPI Jordan Nelson is a 72 y.o. male with PMH chronic bilateral venous insufficiency with neuropathy, GERD, arthritis who presents emergency room for evaluation of left leg pain.  States that symptoms have been progressively worsening over the last 4 days.  No associated trauma to the lower extremities.  Arrives with compression stockings.  Denies chest pain, shortness of breath, Donnell pain, nausea, vomiting or other systemic symptoms.   Past Medical History Past Medical History:  Diagnosis Date   Arthritis    Benign enlargement of prostate    Chronic back pain    Epigastric hernia    GERD (gastroesophageal reflux disease)    Numbness    right hand   Pollen allergy    Scoliosis    " minor"   Tuberculosis    early 1980's   Venous insufficiency (chronic) (peripheral)    Wears glasses    Patient Active Problem List   Diagnosis Date Noted   Incarcerated ventral hernia 04/10/2022   Anemia, unspecified 06/14/2021   Corn of foot 06/14/2021   Carpal tunnel syndrome 06/14/2021   Epistaxis 06/14/2021   Right sided sciatica 06/14/2021   Age-related nuclear cataract, bilateral 06/14/2021   Benign prostatic hyperplasia with lower urinary tract symptoms 06/14/2021   Centrilobular emphysema (HCC) 06/14/2021   Decreased white blood cell count, unspecified 06/14/2021   Edema, unspecified 06/14/2021   Low back pain, unspecified 06/14/2021   Other specified counseling 06/14/2021   Sebaceous cyst 06/14/2021   Varicose veins of unspecified lower extremity with inflammation 06/14/2021   Venous insufficiency (chronic) (peripheral) 06/14/2021   Elevated blood-pressure reading, without diagnosis of hypertension 10/17/2020   Tinea pedis, recurrent 12/24/2019   Onychomycosis of toenail 12/24/2019   Scoliosis of lumbar spine  12/24/2019   Degenerative joint disease (DJD) of lumbar spine 12/24/2019   Home Medication(s) Prior to Admission medications   Medication Sig Start Date End Date Taking? Authorizing Provider  DULoxetine (CYMBALTA) 30 MG capsule Take 1 capsule by mouth daily. 10/27/20   [provider]  finasteride (PROSCAR) 5 MG tablet Take 1 tablet by mouth daily. 07/25/20   [provider]  gabapentin (NEURONTIN) 100 MG capsule Take 1 capsule (100 mg total) by mouth 3 (three) times daily. 06/29/23   Garlon Hatchet, [name]  Ipratropium-Albuterol (COMBIVENT) 20-100 MCG/ACT AERS respimat INHALE 1 PUFF BY MOUTH FOUR TIMES A DAY AS NEEDED - USE CONTINUOUSLY FOR 2 WEEKS THEN AS NEEDED FOR SHORTNESS OF BREATH 07/25/20   [provider]  lidocaine (LIDODERM) 5 % APPLY 1 PATCH TO SKIN ONCE DAILY (APPLY FOR 12 HOURS, THEN REMOVE FOR 12 HOURS) 09/14/20   [provider]  meloxicam (MOBIC) 15 MG tablet Take by mouth. 01/03/20   [provider]  METAMUCIL FIBER PO Take by mouth.    [provider]  tamsulosin (FLOMAX) 0.4 MG CAPS capsule Take 1 capsule by mouth at bedtime. 01/16/21   [provider]  Tiotropium Bromide Monohydrate 1.25 MCG/ACT AERS INHALE 2 PUFFS BY MOUTH ONCE A DAY 09/24/20   [provider]  tiZANidine (ZANAFLEX) 4 MG tablet Take 1-2 tablets (4-8 mg total) by mouth every 6 (six) hours as needed for muscle spasms. 12/24/19   Eustace Moore, MD  traMADol (ULTRAM) 50 MG tablet Take 1 tablet (50 mg total) by mouth every 6 (six) hours as needed.  04/11/22   [name], MD                                                                                                                                    Past Surgical History Past Surgical History:  Procedure Laterality Date   EYE SURGERY     HERNIA REPAIR     UMBILICAL HERNIA REPAIR N/A 04/10/2022   Procedure: OPEN UMBILICAL HERNIA REPAIR WITH MESH;  Surgeon: [name], MD;   Location: [facility];  Service: General;  Laterality: N/A;   Family History Family History  Problem Relation Age of Onset   Hypertension Mother     Social History Social History   Tobacco Use   Smoking status: Former    Types: Cigarettes    Passive exposure: Never   Smokeless tobacco: Never  Vaping Use   Vaping status: Never Used  Substance Use Topics   Alcohol use: No   Drug use: No   Allergies Patient has no known allergies.  Review of Systems Review of Systems  Musculoskeletal:  Positive for arthralgias and myalgias.    Physical Exam Vital Signs  I have reviewed the triage vital signs BP (!) 186/101 (BP Location: Right Arm)   Pulse 89   Temp 97.8 F (36.6 C) (Oral)   Resp 18   SpO2 100%   Physical Exam Constitutional:      General: He is not in acute distress.    Appearance: Normal appearance.  HENT:     Head: Normocephalic and atraumatic.     Nose: No congestion or rhinorrhea.  Eyes:     General:        Right eye: No discharge.        Left eye: No discharge.     Extraocular Movements: Extraocular movements intact.     Pupils: Pupils are equal, round, and reactive to light.  Cardiovascular:     Rate and Rhythm: Normal rate and regular rhythm.     Heart sounds: No murmur heard. Pulmonary:     Effort: No respiratory distress.     Breath sounds: No wheezing or rales.  Abdominal:     General: There is no distension.     Tenderness: There is no abdominal tenderness.  Musculoskeletal:        General: Normal range of motion.     Cervical back: Normal range of motion.     Right lower leg: Edema present.     Left lower leg: Edema present.  Skin:    General: Skin is warm and dry.  Neurological:     General: No focal deficit present.     Mental Status: He is alert.     ED Results and Treatments Labs (all labs ordered are listed, but only abnormal results are displayed) Labs Reviewed  D-DIMER, QUANTITATIVE  Radiology No results found.  Pertinent labs & imaging results that were available during my care of the patient were reviewed by me and considered in my medical decision making (see MDM for details).  Medications Ordered in ED Medications  gabapentin (NEURONTIN) capsule 300 mg (has no administration in time range)                                                                                                                                     Procedures Procedures  (including critical care time)  Medical Decision Making / ED Course   This patient presents to the ED for concern of leg pain, this involves an extensive number of treatment options, and is a complaint that carries with it a high risk of complications and morbidity.  The differential diagnosis includes gravity dependent edema, venous insufficiency, DVT, neuropathy, fracture  MDM: Patient seen emergency room for evaluation of leg pain.  Physical exam with bilateral lower extremity pitting edema that is mild but left greater than right.  X-ray imaging reassuringly negative.  D-dimer slightly elevated at 0.54 and patient technically is negative by age adjusted D-dimer but patient would like an ultrasound to be sure.  We unfortunately do not have ultrasound at night and patient will wait in the emergency department till 7 AM to have the scan performed.  Please see provider signout of continuation of workup.  Anticipate discharge.   Additional history obtained:  -External records from outside source obtained and reviewed including: Chart review including previous notes, labs, imaging, consultation notes   Lab Tests: -I ordered, reviewed, and interpreted labs.   The pertinent results include:   Labs Reviewed  D-DIMER, QUANTITATIVE        Imaging Studies ordered: I ordered imaging studies including x-ray tib-fib I  independently visualized and interpreted imaging. I agree with the radiologist interpretation  DVT ultrasound pending   Medicines ordered and prescription drug management: Meds ordered this encounter  Medications   gabapentin (NEURONTIN) capsule 300 mg    -I have reviewed the patients home medicines and have made adjustments as needed  Critical interventions none  Cardiac Monitoring: The patient was maintained on a cardiac monitor.  I personally viewed and interpreted the cardiac monitored which showed an underlying rhythm of: NSR  Social Determinants of Health:  Factors impacting patients care include: none   Reevaluation: After the interventions noted above, I reevaluated the patient and found that they have :improved  Co morbidities that complicate the patient evaluation  Past Medical History:  Diagnosis Date   Arthritis    Benign enlargement of prostate    Chronic back pain    Epigastric hernia    GERD (gastroesophageal reflux disease)    Numbness    right hand   Pollen allergy    Scoliosis    " minor"   Tuberculosis    early 1980's   Venous insufficiency (chronic) (peripheral)  Wears glasses       Dispostion: I considered admission for this patient, and patient currently pending DVT ultrasound at time of signout.  Please see provider signout for continuation of workup     Final Clinical Impression(s) / ED Diagnoses Final diagnoses:  None     @PCDICTATION @    Kiran Carline, Wyn Forster, MD 08/06/23 [id number]

## 2023-08-06 NOTE — Discharge Instructions (Signed)
Your ultrasound shows no signs of blood clot, you can follow-up with your family doctor, ER for worsening symptoms

## 2023-08-18 ENCOUNTER — Telehealth: Payer: Self-pay

## 2023-08-18 NOTE — Progress Notes (Signed)
 Transition Care Management Unsuccessful Follow-up Telephone Call  Date of discharge and from where:  08/06/2023 [facility]  Attempts:  1st Attempt  Reason for unsuccessful TCM follow-up call:  No answer/busy  Wanda Rideout [facility]  [facility], [facility] Resource Care Guide Direct Dial: [phone]  Website: [email or web address]

## 2023-08-19 ENCOUNTER — Telehealth: Payer: Self-pay

## 2023-08-19 NOTE — Progress Notes (Signed)
 Transition Care Management Unsuccessful Follow-up Telephone Call  Date of discharge and from where:  08/06/2023 [facility]  Attempts:  2nd Attempt  Reason for unsuccessful TCM follow-up call:  No answer/busy  Avante Carneiro [facility]  [facility], [facility] Resource Care Guide Direct Dial: [phone]  Website: [email or web address]

## 2023-09-16 ENCOUNTER — Telehealth: Payer: Self-pay

## 2023-09-16 NOTE — Telephone Encounter (Addendum)
 Pt called stating that he has an appt on 2/6, but has some questions regarding that appt and some leg/foot swelling.  Reviewed pt's chart, returned call for clarification, no answer, lf vm.  Pt called, returned call.  Pt had multiple questions regarding leg swelling. He had changed his schedule and is going to bed later in the early morning. Therefore, he has not been elevating his feet as often and he feels that had contributed to increased swelling. Reviewed measures he could take regarding elevation, compression, and exercise to help with swelling. Confirmed understanding.

## 2023-09-17 ENCOUNTER — Other Ambulatory Visit: Payer: Self-pay | Admitting: *Deleted

## 2023-09-17 DIAGNOSIS — I83891 Varicose veins of right lower extremities with other complications: Secondary | ICD-10-CM

## 2023-09-17 DIAGNOSIS — I872 Venous insufficiency (chronic) (peripheral): Secondary | ICD-10-CM

## 2023-09-18 ENCOUNTER — Ambulatory Visit (INDEPENDENT_AMBULATORY_CARE_PROVIDER_SITE_OTHER): Payer: No Typology Code available for payment source | Admitting: Vascular Surgery

## 2023-09-18 ENCOUNTER — Encounter: Payer: Self-pay | Admitting: Vascular Surgery

## 2023-09-18 ENCOUNTER — Ambulatory Visit (HOSPITAL_COMMUNITY)
Admission: RE | Admit: 2023-09-18 | Discharge: 2023-09-18 | Disposition: A | Discharge status: Home / Self Care | Payer: No Typology Code available for payment source | Source: Ambulatory Visit | Attending: Vascular Surgery | Admitting: Vascular Surgery

## 2023-09-18 VITALS — BP 120/76 | HR 74 | Temp 98.3°F | Resp 20 | Ht 71.0 in | Wt 167.0 lb

## 2023-09-18 DIAGNOSIS — I83891 Varicose veins of right lower extremities with other complications: Secondary | ICD-10-CM

## 2023-09-18 DIAGNOSIS — I872 Venous insufficiency (chronic) (peripheral): Secondary | ICD-10-CM

## 2023-09-18 NOTE — Progress Notes (Signed)
 [facility]           [facility] [place]  History and Physical   Nickolai L Morin is a 73 y.o. male who has been seen in the past for swelling and bleeding varicosity.  He was last seen in 2023 with some lower extremity edema. He was last seen in July 2023 and at that time, he was having swelling.  Prior imaging has demonstrated some focal valvular reflux, however not enough to warrant intervention.  His primary care is through the [facility].  He also gets his compression socks through the [facility].    On exam today, Adrean was doing well.  Over the last several months, he stated he has not been sleeping much at night, working on his computer until 4:56 in the morning.  He has noted significant swelling because of this.  The swelling waxes and wanes.,  And is worse after being in the dependent position at his computer.  He denies bleeding events.  Denies color changes denies claudication, ischemic rest pain, tissue loss. He thinks his symptoms would improve he wore compression on a regular basis, and elevated more.   The pt is not on a statin for cholesterol management.  The pt is not on a daily aspirin.   Other AC:  none The pt is not on medication for hypertension.   The pt is not diabetic.   Tobacco hx:  former  He does not have family hx of AAA.   Past Medical History:  Diagnosis Date   Arthritis    Benign enlargement of prostate    Chronic back pain    Epigastric hernia    GERD (gastroesophageal reflux disease)    Numbness    right hand   Pollen allergy    Scoliosis     minor   Tuberculosis    early 1980's   Venous insufficiency (chronic) (peripheral)    Wears glasses     Past Surgical History:  Procedure Laterality Date   EYE SURGERY     HERNIA REPAIR     UMBILICAL HERNIA REPAIR N/A 04/10/2022   Procedure: OPEN UMBILICAL HERNIA REPAIR WITH MESH;  Surgeon: Vernetta Berg, MD;  Location: [facility];  Service: General;  Laterality: N/A;     Social History   Socioeconomic History   Marital status: Single    Spouse name: Not on file   Number of children: Not on file   Years of education: Not on file   Highest education level: Not on file  Occupational History   Occupation: Semi-Retired  Tobacco Use   Smoking status: Former    Types: Cigarettes    Passive exposure: Never   Smokeless tobacco: Never  Vaping Use   Vaping status: Never Used  Substance and Sexual Activity   Alcohol use: No   Drug use: No   Sexual activity: Not on file  Other Topics Concern   Not on file  Social History Narrative   Not on file   Social Drivers of Health   Financial Resource Strain: Not on file  Food Insecurity: Not on file  Transportation Needs: Not on file  Physical Activity: Not on file  Stress: Not on file  Social Connections: Unknown (12/25/2021)   Received from [facility]   Social Network    Social Network: Not on file  Intimate Partner Violence: Unknown (11/16/2021)   Received from [facility], [facility]   HITS  Physically Hurt: Not on file    Insult or Talk Down To: Not on file    Threaten Physical Harm: Not on file    Scream or Curse: Not on file     Family History  Problem Relation Age of Onset   Hypertension Mother     Current Outpatient Medications  Medication Sig Dispense Refill   DULoxetine (CYMBALTA) 30 MG capsule Take 1 capsule by mouth daily.     finasteride  (PROSCAR ) 5 MG tablet Take 1 tablet by mouth daily.     gabapentin  (NEURONTIN ) 100 MG capsule Take 1 capsule (100 mg total) by mouth 3 (three) times daily. 30 capsule 0   Ipratropium-Albuterol (COMBIVENT) 20-100 MCG/ACT AERS respimat INHALE 1 PUFF BY MOUTH FOUR TIMES A DAY AS NEEDED - USE CONTINUOUSLY FOR 2 WEEKS THEN AS NEEDED FOR SHORTNESS OF BREATH     lidocaine  (LIDODERM ) 5 % APPLY 1 PATCH TO SKIN ONCE DAILY (APPLY FOR 12 HOURS, THEN REMOVE FOR 12 HOURS)     meloxicam  (MOBIC ) 15 MG tablet Take by mouth.     METAMUCIL  FIBER PO Take by mouth.     tamsulosin  (FLOMAX ) 0.4 MG CAPS capsule Take 1 capsule by mouth at bedtime.     Tiotropium Bromide Monohydrate 1.25 MCG/ACT AERS INHALE 2 PUFFS BY MOUTH ONCE A DAY     tiZANidine  (ZANAFLEX ) 4 MG tablet Take 1-2 tablets (4-8 mg total) by mouth every 6 (six) hours as needed for muscle spasms. 30 tablet 1   traMADol  (ULTRAM ) 50 MG tablet Take 1 tablet (50 mg total) by mouth every 6 (six) hours as needed. 25 tablet 0   No current facility-administered medications for this visit.    No Known Allergies  REVIEW OF SYSTEMS:   [X]  denotes positive finding, [ ]  denotes negative finding Cardiac  Comments:  Chest pain or chest pressure:    Shortness of breath upon exertion:    Short of breath when lying flat:    Irregular heart rhythm:        Vascular    Pain in calf, thigh, or hip brought on by ambulation:    Pain in feet at night that wakes you up from your sleep:     Blood clot in your veins:    Leg swelling:  x       Pulmonary    Oxygen at home:    Productive cough:     Wheezing:         Neurologic    Sudden weakness in arms or legs:     Sudden numbness in arms or legs:     Sudden onset of difficulty speaking or slurred speech:    Temporary loss of vision in one eye:     Problems with dizziness:         Gastrointestinal    Blood in stool:     Vomited blood:         Genitourinary    Burning when urinating:     Blood in urine:        Psychiatric    Major depression:         Hematologic    Bleeding problems:    Problems with blood clotting too easily:        Skin    Rashes or ulcers:        Constitutional    Fever or chills:      PHYSICAL EXAMINATION:  Today's Vitals   09/18/23 1428  BP: 120/76  Pulse: 74  Resp: 20  Temp: 98.3 F (36.8 C)  SpO2: 99%  Weight: 167 lb (75.8 kg)  Height: 5' 11 (1.803 m)   Body mass index is 23.29 kg/m.   General:  WDWN in NAD; vital signs documented above Gait: Not observed HENT: WNL,  normocephalic Pulmonary: normal non-labored breathing without wheezing Cardiac: regular HR; without carotid bruits Abdomen: soft, NT, no masses; aortic pulse is not palpable; abdominal wall hernia present Skin: without rashes Vascular Exam/Pulses:  Right Left  Radial 2+ (normal) 2+ (normal)  DP 2+ (normal) 2+ (normal)   Extremities: mild BLE swelling with hemosiderin staining BLE.   Neurologic: A&O X 3;  moving all extremities equally Psychiatric:  The pt has Normal affect.   Non-Invasive Vascular Imaging:   Venous duplex on 03/02/2022: Venous Reflux Times  +--------------+---------+------+-----------+------------+------------+  RIGHT         Reflux NoRefluxReflux TimeDiameter cmsComments                              Yes                                       +--------------+---------+------+-----------+------------+------------+  CFV                     yes   >1 second                           +--------------+---------+------+-----------+------------+------------+  FV mid        no                                                  +--------------+---------+------+-----------+------------+------------+  Popliteal     no                                                  +--------------+---------+------+-----------+------------+------------+  GSV at [facility]    no                            0.31                  +--------------+---------+------+-----------+------------+------------+  GSV prox thighno                            0.29                  +--------------+---------+------+-----------+------------+------------+  GSV mid thigh no                            0.26                  +--------------+---------+------+-----------+------------+------------+  GSV dist thighno                            0.26                  +--------------+---------+------+-----------+------------+------------+  GSV at knee   no                             0.24                  +--------------+---------+------+-----------+------------+------------+  GSV prox calf no                            0.27                  +--------------+---------+------+-----------+------------+------------+  GSV mid calf            yes    >500 ms      0.20    0.22 cm deep  +--------------+---------+------+-----------+------------+------------+  SSV Pop Fossa no                            0.18                  +--------------+---------+------+-----------+------------+------------+  perforator    no                            0.25                  +--------------+---------+------+-----------+------------+------------+   Summary:  Right:  - No evidence of deep vein thrombosis from the common femoral through the popliteal veins.  - No evidence of superficial venous thrombosis.  - The common femoral vein is not competent.  - The great saphenous vein is not competent in the mid calf only.  - The small saphenous vein is competent.  - Perforator observed in the mid calf.   ** Venous Reflux Times  +-------------------------+---------+------+-----------+------------+------  --+  RIGHT                   Reflux NoRefluxReflux TimeDiameter  cmsComments                                    Yes                                    +-------------------------+---------+------+-----------+------------+------  --+  CFV                               yes   >1 second                        +-------------------------+---------+------+-----------+------------+------  --+  GSV at SFJ                         yes    >500 ms      0.64               +-------------------------+---------+------+-----------+------------+------  --+  GSV prox thigh           no                            0.30               +-------------------------+---------+------+-----------+------------+------  --+  GSV mid thigh             no                            0.30               +-------------------------+---------+------+-----------+------------+------  --+  GSV dist thigh           no                            0.30               +-------------------------+---------+------+-----------+------------+------  --+  GSV at knee              no                            0.40               +-------------------------+---------+------+-----------+------------+------  --+  GSV prox calf                      yes    >500 ms      0.40               +-------------------------+---------+------+-----------+------------+------  --+  GSV mid calf                              >500 ms      0.40               +-------------------------+---------+------+-----------+------------+------  --+  SSV Pop Fossa            no                            0.30               +-------------------------+---------+------+-----------+------------+------  --+  SSV prox calf            no                            0.20               +-------------------------+---------+------+-----------+------------+------  --+  SSV mid calf             no                            0.20               +-------------------------+---------+------+-----------+------------+------  --+  Medial Calf Varicosities           yes    >500 ms      0.30               +-------------------------+---------+------+-----------+------------+------  --+  Lateral Calf Varicosities          yes    >500 ms      0.20               +-------------------------+---------+------+-----------+------------+------     Terril LITTIE Hipp is a 73 y.o. male who presents with: leg swelling  -pt has easily palpable DP pedal pulses, no concerning varicosities -pt does not have evidence of DVT.  Pt  does have focal areas of reflux as demonstrated above.   -I had a long conversation with him regarding the above.  With mild,  focal reflux, he would not have significant benefit with laser ablation. -We discussed the importance of wearing compression stockings, especially when sitting in the dependent position at his computer. -discussed the importance of leg elevation and how to elevate properly - pt is advised to elevate their legs and a diagram is given to them to demonstrate for pt to lay flat on their back with knees elevated and slightly bent with their feet higher than their knees, which puts their feet higher than their heart for 15 minutes per day.  If pt cannot lay flat, advised to lay as flat as possible.  -pt is advised to continue as much walking as possible and avoid sitting or standing for long periods of time.  He uses an exercise bike and walks for  -pt will f/u as needed  Fonda FORBES Rim  [facility] [phone]

## 2023-10-14 DIAGNOSIS — M5416 Radiculopathy, lumbar region: Secondary | ICD-10-CM | POA: Diagnosis not present

## 2023-10-25 DIAGNOSIS — M5416 Radiculopathy, lumbar region: Secondary | ICD-10-CM | POA: Diagnosis not present

## 2023-12-04 ENCOUNTER — Telehealth: Payer: Self-pay

## 2023-12-04 NOTE — Telephone Encounter (Signed)
 Jordan Nelson

## 2023-12-05 ENCOUNTER — Telehealth: Payer: Self-pay | Admitting: Gastroenterology

## 2023-12-05 NOTE — Telephone Encounter (Signed)
 Good morning Dr. Karene Oto,  Doc of Day 4/25  We received a call from this patient wishing to schedule a [facility] with [facility] in [place]. Patient states he does not have transportation in that city and states was advised to schedule a colonoscopy locally. [facility] He was also seen in April 2025 for a consultation with them. Records from visits were obtained and scanned into Media for you to review. Would you please advise on scheduling?  Thank you.

## 2023-12-07 DIAGNOSIS — M5459 Other low back pain: Secondary | ICD-10-CM | POA: Diagnosis not present

## 2023-12-07 DIAGNOSIS — M48062 Spinal stenosis, lumbar region with neurogenic claudication: Secondary | ICD-10-CM | POA: Diagnosis not present

## 2023-12-09 ENCOUNTER — Encounter: Payer: Self-pay | Admitting: Gastroenterology

## 2023-12-09 NOTE — Telephone Encounter (Signed)
 Patient scheduled for direct colonoscopy for 6/23 at 12:30 PM

## 2023-12-09 NOTE — Telephone Encounter (Signed)
 Left voicemail detailing recommendations from Dr. Karene Oto, if patient decides to schedule colonoscopy.

## 2023-12-20 ENCOUNTER — Encounter (HOSPITAL_COMMUNITY): Payer: Self-pay

## 2023-12-20 ENCOUNTER — Ambulatory Visit (HOSPITAL_COMMUNITY): Admission: EM | Admit: 2023-12-20 | Discharge: 2023-12-20 | Disposition: A | Discharge status: Home / Self Care

## 2023-12-20 DIAGNOSIS — I872 Venous insufficiency (chronic) (peripheral): Secondary | ICD-10-CM | POA: Diagnosis not present

## 2023-12-20 DIAGNOSIS — L97319 Non-pressure chronic ulcer of right ankle with unspecified severity: Secondary | ICD-10-CM

## 2023-12-20 DIAGNOSIS — I83013 Varicose veins of right lower extremity with ulcer of ankle: Secondary | ICD-10-CM

## 2023-12-20 MED ORDER — TRAMADOL HCL 50 MG PO TABS: 50.0000 mg | ORAL_TABLET | Freq: Three times a day (TID) | ORAL | 0 refills | Status: DC | PRN

## 2023-12-20 MED ORDER — CEPHALEXIN 500 MG PO CAPS: 500.0000 mg | ORAL_CAPSULE | Freq: Four times a day (QID) | ORAL | 0 refills | Status: AC

## 2023-12-20 NOTE — Discharge Instructions (Addendum)
 You have a small venous stasis ulcer on your right ankle, which is related to poor blood flow in your leg. The wound is currently clean and dry without signs of infection, but it is painful. You have been prescribed Keflex 500 mg to take four times a day for 5 days to help prevent or treat any early infection. You can take tylenol  for mild to moderate pain. You have been prescribed tramadol  that you should only take for severe pain. Continue wearing your compression stockings daily and keep your leg elevated as much as possible to reduce swelling and support healing. Change the dressing as needed to keep the wound clean and dry. Watch for signs of infection such as increased redness, swelling, drainage, or odor. Call your vascular specialist on Monday to schedule a follow-up appointment. If the wound worsens or you develop new symptoms, contact your healthcare provider.

## 2023-12-20 NOTE — ED Provider Notes (Signed)
 [facility]    CSN: [id number] Arrival date & time: 12/20/23  1529      History   Chief Complaint Chief Complaint  Patient presents with   Leg Swelling   Sore    HPI Jordan Nelson is a 73 y.o. male.   Jordan Nelson is a 73 y.o. male with a history of venous reflux, that presents with pain and swelling in his right leg that started about a week ago. He reports having venous insufficiency, which affects his blood flow and can cause sores and skin disruption.The patient describes pain specifically in the sore area of his right medial ankle, which can reach a severity of 10 out of 10 when he is at home and walking around. He denies pain in his left leg. Jordan Nelson mentions having a couple of sores on his leg, one of which has healed, while the other has not. He has not taken any medication for the pain prior to this visit. The patient reports wearing compression stockings as previously recommended by his vascular specialist.Jordan Nelson notes that he has sciatica on the affected side, occasionally causing pain in his back and leg, but states this is not new. He denies any fever or specific pain in the back of his leg or calf. The patient mentions that walking sometimes helps with the pain, and he tries to avoid sitting or standing for long periods. The patient last saw a vascular vein specialist in February, where imaging showed focal valvular reflux but no evidence of deep vein thrombosis or superficial vein issues. He attempted to schedule a follow-up appointment with his vascular specialist last Thursday but has not yet secured an appointment. He states that he will call again on Monday. Jordan Nelson also reports having abdominal issues, possibly cancer, which cause cramping and stiffness. He mentions being unable to take ibuprofen or naproxen  due to medication prescribed by his GI specialist at the [place].  The following portions of the patient's history were reviewed and updated as  appropriate: allergies, current medications, past family history, past medical history, past social history, past surgical history, and problem list.         Past Medical History:  Diagnosis Date   Arthritis    Benign enlargement of prostate    Chronic back pain    Epigastric hernia    GERD (gastroesophageal reflux disease)    Numbness    right hand   Pollen allergy    Scoliosis    " minor"   Tuberculosis    early 1980's   Venous insufficiency (chronic) (peripheral)    Wears glasses     Patient Active Problem List   Diagnosis Date Noted   Incarcerated ventral hernia 04/10/2022   Anemia, unspecified 06/14/2021   Corn of foot 06/14/2021   Carpal tunnel syndrome 06/14/2021   Epistaxis 06/14/2021   Right sided sciatica 06/14/2021   Age-related nuclear cataract, bilateral 06/14/2021   Benign prostatic hyperplasia with lower urinary tract symptoms 06/14/2021   Centrilobular emphysema (HCC) 06/14/2021   Decreased white blood cell count, unspecified 06/14/2021   Edema, unspecified 06/14/2021   Low back pain, unspecified 06/14/2021   Other specified counseling 06/14/2021   Sebaceous cyst 06/14/2021   Varicose veins of unspecified lower extremity with inflammation 06/14/2021   Venous insufficiency (chronic) (peripheral) 06/14/2021   Elevated blood-pressure reading, without diagnosis of hypertension 10/17/2020   Tinea pedis, recurrent 12/24/2019   Onychomycosis of toenail 12/24/2019   Scoliosis of lumbar spine 12/24/2019  Degenerative joint disease (DJD) of lumbar spine 12/24/2019    Past Surgical History:  Procedure Laterality Date   EYE SURGERY     HERNIA REPAIR     UMBILICAL HERNIA REPAIR N/A 04/10/2022   Procedure: OPEN UMBILICAL HERNIA REPAIR WITH MESH;  Surgeon: Oza Blumenthal, MD;  Location: [facility];  Service: General;  Laterality: N/A;       Home Medications    Prior to Admission medications   Medication Sig Start Date End Date  Taking? Authorizing Provider  DULoxetine (CYMBALTA) 30 MG capsule Take 1 capsule by mouth daily. 10/27/20  Yes [provider]  finasteride  (PROSCAR ) 5 MG tablet Take 1 tablet by mouth daily. 07/25/20  Yes [provider]  Ipratropium-Albuterol (COMBIVENT) 20-100 MCG/ACT AERS respimat INHALE 1 PUFF BY MOUTH FOUR TIMES A DAY AS NEEDED - USE CONTINUOUSLY FOR 2 WEEKS THEN AS NEEDED FOR SHORTNESS OF BREATH 07/25/20  Yes [provider]  lidocaine  (LIDODERM ) 5 % APPLY 1 PATCH TO SKIN ONCE DAILY (APPLY FOR 12 HOURS, THEN REMOVE FOR 12 HOURS) 09/14/20  Yes [provider]  POLYETHYLENE GLYCOL 3350 PO Take by mouth. 09/17/23  Yes [provider]  tamsulosin  (FLOMAX ) 0.4 MG CAPS capsule Take 1 capsule by mouth at bedtime. 01/16/21  Yes [provider]  Tiotropium Bromide Monohydrate 1.25 MCG/ACT AERS INHALE 2 PUFFS BY MOUTH ONCE A DAY 09/24/20  Yes [provider]  tiZANidine  (ZANAFLEX ) 4 MG tablet Take 1-2 tablets (4-8 mg total) by mouth every 6 (six) hours as needed for muscle spasms. 12/24/19  Yes Stephany Ehrich, MD  traMADol  (ULTRAM ) 50 MG tablet Take 1 tablet (50 mg total) by mouth every 8 (eight) hours as needed for severe pain (pain score 7-10). 12/20/23  Yes Choua Chalker, [name]  cephALEXin (KEFLEX) 500 MG capsule Take 1 capsule (500 mg total) by mouth 4 (four) times daily for 5 days. 12/20/23 12/25/23 Yes Maryruth Sol, [name]    Family History Family History  Problem Relation Age of Onset   Hypertension Mother     Social History Social History   Tobacco Use   Smoking status: Former    Types: Cigarettes    Passive exposure: Never   Smokeless tobacco: Never  Vaping Use   Vaping status: Never Used  Substance Use Topics   Alcohol use: No   Drug use: No     Allergies   Patient has no known allergies.   Review of Systems Review of Systems  Constitutional:  Negative for fever.  Musculoskeletal:  Positive for arthralgias  (right ankle pain). Negative for gait problem, joint swelling and myalgias.  Skin:  Positive for wound.  Neurological:  Negative for weakness and numbness.  All other systems reviewed and are negative.    Physical Exam Triage Vital Signs ED Triage Vitals  Encounter Vitals Group     BP 12/20/23 1554 116/75     Systolic BP Percentile --      Diastolic BP Percentile --      Pulse Rate 12/20/23 1554 68     Resp 12/20/23 1554 16     Temp 12/20/23 1554 97.8 F (36.6 C)     Temp src --      SpO2 12/20/23 1554 98 %     Weight --      Height --      Head Circumference --      Peak Flow --      Pain Score 12/20/23 1548 10  Pain Loc --      Pain Education --      Exclude from Growth Chart --    No data found.  Updated Vital Signs BP 116/75 (BP Location: Left Arm)   Pulse 68   Temp 97.8 F (36.6 C)   Resp 16   SpO2 98%   Visual Acuity Right Eye Distance:   Left Eye Distance:   Bilateral Distance:    Right Eye Near:   Left Eye Near:    Bilateral Near:     Physical Exam Vitals reviewed.  Constitutional:      General: He is not in acute distress.    Appearance: Normal appearance. He is not toxic-appearing.  HENT:     Head: Normocephalic.     Mouth/Throat:     Mouth: Mucous membranes are moist.  Eyes:     Conjunctiva/sclera: Conjunctivae normal.  Cardiovascular:     Rate and Rhythm: Normal rate and regular rhythm.     Pulses:          Dorsalis pedis pulses are 2+ on the right side.     Heart sounds: Normal heart sounds.  Pulmonary:     Effort: Pulmonary effort is normal.     Breath sounds: Normal breath sounds.  Musculoskeletal:        General: Normal range of motion.     Right lower leg: No swelling or tenderness. No edema.     Right ankle: No swelling. Tenderness present over the medial malleolus. Normal range of motion.     Right foot: Normal range of motion. No swelling, deformity or tenderness. Normal pulse.     Comments: Venous stasis ulcer noted to  the medial aspect of the right ankle. No increased warmth, redness, swelling, odor or drainage noted. Ulcer measures about 1 x 0.5 cm in diameter.   Feet:     Right foot:     Skin integrity: Dry skin present. No ulcer, skin breakdown or warmth.     Toenail Condition: Right toenails are normal.  Skin:    General: Skin is warm and dry.  Neurological:     General: No focal deficit present.     Mental Status: He is alert and oriented to person, place, and time.       Ulceration to the medial aspect of right ankle   UC Treatments / Results  Labs (all labs ordered are listed, but only abnormal results are displayed) Labs Reviewed - No data to display  EKG   Radiology No results found.  Procedures Procedures (including critical care time)  Medications Ordered in UC Medications - No data to display  Initial Impression / Assessment and Plan / UC Course  I have reviewed the triage vital signs and the nursing notes.  Pertinent labs & imaging results that were available during my care of the patient were reviewed by me and considered in my medical decision making (see chart for details).    73 year old male with a history of venous reflux presents with right leg pain related to a persistent sore over the medial right ankle. Physical exam reveals a small, painful venous stasis ulcer without surrounding redness, swelling, drainage, or odor. The wound is currently covered with a dry dressing. Given the localized pain and concern for possible early infection, Keflex 500 mg four times daily for 5 days was prescribed. The patient was advised to continue supportive care, including use of compression stockings and leg elevation. He was instructed to monitor the wound  closely and follow up with his vascular specialist; he plans to call on Monday to schedule an appointment.   Today's evaluation has revealed no signs of a dangerous process. Discussed diagnosis with patient and/or guardian. Patient  and/or guardian aware of their diagnosis, possible red flag symptoms to watch out for and need for close follow up. Patient and/or guardian understands verbal and written discharge instructions. Patient and/or guardian comfortable with plan and disposition.  Patient and/or guardian has a clear mental status at this time, good insight into illness (after discussion and teaching) and has clear judgment to make decisions regarding their care  Documentation was completed with the aid of voice recognition software. Transcription may contain typographical errors. Final Clinical Impressions(s) / UC Diagnoses   Final diagnoses:  Venous stasis ulcer of right ankle, unspecified ulcer stage, unspecified whether varicose veins present (HCC)  Chronic venous insufficiency     Discharge Instructions      You have a small venous stasis ulcer on your right ankle, which is related to poor blood flow in your leg. The wound is currently clean and dry without signs of infection, but it is painful. You have been prescribed Keflex 500 mg to take four times a day for 5 days to help prevent or treat any early infection. You can take tylenol  for mild to moderate pain. You have been prescribed tramadol  that you should only take for severe pain. Continue wearing your compression stockings daily and keep your leg elevated as much as possible to reduce swelling and support healing. Change the dressing as needed to keep the wound clean and dry. Watch for signs of infection such as increased redness, swelling, drainage, or odor. Call your vascular specialist on Monday to schedule a follow-up appointment. If the wound worsens or you develop new symptoms, contact your healthcare provider.    ED Prescriptions     Medication Sig Dispense Auth. Provider   cephALEXin (KEFLEX) 500 MG capsule Take 1 capsule (500 mg total) by mouth 4 (four) times daily for 5 days. 20 capsule Maryruth Sol, [name]   traMADol  (ULTRAM ) 50 MG tablet Take  1 tablet (50 mg total) by mouth every 8 (eight) hours as needed for severe pain (pain score 7-10). 12 tablet Maryruth Sol, [name]      I have reviewed the PDMP during this encounter.   Maryruth Sol, [place] 12/20/23 [phone]

## 2023-12-20 NOTE — ED Triage Notes (Signed)
 Patient presenting with sores and swelling in the right lower leg onset a little over a week ago. States the legs are painful as well. No known injury. States has a history of Veinous reflux causing swelling in the lower right leg it then makes him prone to sores. States this sore is not going away like the others would.  Prescriptions or OTC medications tried: No

## 2023-12-22 ENCOUNTER — Telehealth (HOSPITAL_COMMUNITY): Payer: Self-pay

## 2023-12-22 MED ORDER — ONDANSETRON HCL 4 MG PO TABS: 4.0000 mg | ORAL_TABLET | Freq: Four times a day (QID) | ORAL | 0 refills | Status: DC | PRN

## 2023-12-22 NOTE — Addendum Note (Signed)
 Addended by: Sharilyn Davenport on: 12/22/2023 01:07 PM   Modules accepted: Orders

## 2023-12-22 NOTE — Telephone Encounter (Signed)
 Failure to e-scribe. Verbal order to pharmacy.

## 2023-12-22 NOTE — Addendum Note (Signed)
 Addended by: Sharilyn Davenport on: 12/22/2023 01:05 PM   Modules accepted: Orders

## 2023-12-22 NOTE — Telephone Encounter (Signed)
 Pt called reporting nausea s/t medication.  Pt was prescribed Keflex and Tramadol  on 5/10. Picked up Tramadol  yesterday and has been experiencing nausea. Please advise if we can send pt anti-emetic or pt needs change in treatment. Pt does report some pain relief with tramadol .  Thank you.

## 2023-12-24 ENCOUNTER — Telehealth (HOSPITAL_COMMUNITY): Payer: Self-pay

## 2023-12-24 ENCOUNTER — Telehealth: Payer: Self-pay

## 2023-12-24 NOTE — Telephone Encounter (Signed)
 Advice: -pt called with c/o ulcer on R lower leg & had been to the urgent care yesterday to evaluate it.   It was noted he has pedal pulses. He was not sure what to do next.   -reviewed chart, noted previous ulcer with Unna boot treatments at the wound center.  Pt confirms he did have that and it did -recommended pt call the MD from the urgent care and see if [facility] is [facility]. Pt will call if he has any concerns as in worsening pain, swelling, purple or cold limb

## 2023-12-29 ENCOUNTER — Telehealth (HOSPITAL_COMMUNITY): Payer: Self-pay

## 2024-01-09 ENCOUNTER — Other Ambulatory Visit: Payer: Self-pay

## 2024-01-09 ENCOUNTER — Emergency Department (HOSPITAL_COMMUNITY)
Admission: EM | Admit: 2024-01-09 | Discharge: 2024-01-10 | Disposition: A | Discharge status: Home / Self Care | Attending: Emergency Medicine | Admitting: Emergency Medicine

## 2024-01-09 ENCOUNTER — Encounter (HOSPITAL_COMMUNITY): Payer: Self-pay

## 2024-01-09 DIAGNOSIS — I872 Venous insufficiency (chronic) (peripheral): Secondary | ICD-10-CM

## 2024-01-09 DIAGNOSIS — L97921 Non-pressure chronic ulcer of unspecified part of left lower leg limited to breakdown of skin: Secondary | ICD-10-CM | POA: Insufficient documentation

## 2024-01-09 DIAGNOSIS — L97311 Non-pressure chronic ulcer of right ankle limited to breakdown of skin: Secondary | ICD-10-CM | POA: Diagnosis present

## 2024-01-09 NOTE — ED Triage Notes (Signed)
 Skin ulcer to right ankle pt says has been present ~1 week.   Says pain has intensified and unable to tolerate at home.   Denies diabetes.

## 2024-01-10 LAB — CBG MONITORING, ED: Glucose-Capillary: 78 mg/dL (ref 70–99)

## 2024-01-10 MED ORDER — ACETAMINOPHEN 325 MG PO TABS
650.0000 mg | ORAL_TABLET | Freq: Once | ORAL | Status: AC
  Administered 2024-01-10: 650 mg via ORAL
  Filled 2024-01-10: qty 2

## 2024-01-10 MED ORDER — IBUPROFEN 400 MG PO TABS
400.0000 mg | ORAL_TABLET | Freq: Once | ORAL | Status: DC
  Filled 2024-01-10: qty 1

## 2024-01-10 NOTE — ED Provider Notes (Signed)
 [place] EMERGENCY DEPARTMENT AT [facility] Provider Note   CSN: [id number] Arrival date & time: 01/09/24  2245     History  Chief Complaint  Patient presents with   Skin Ulcer    Jordan Nelson is a 73 y.o. male.  The history is provided by the patient.  Patient presents with an ulcer to his right ankle he has had for over a week. He reports the pain is worsening. Denies any trauma but he reports that his wound is likely due to venous stasis.  No numbness or weakness in the foot.  Reports he is only prediabetic      Home Medications Prior to Admission medications   Medication Sig Start Date End Date Taking? Authorizing Provider  DULoxetine (CYMBALTA) 30 MG capsule Take 1 capsule by mouth daily. 10/27/20   [provider]  finasteride  (PROSCAR ) 5 MG tablet Take 1 tablet by mouth daily. 07/25/20   [provider]  Ipratropium-Albuterol (COMBIVENT) 20-100 MCG/ACT AERS respimat INHALE 1 PUFF BY MOUTH FOUR TIMES A DAY AS NEEDED - USE CONTINUOUSLY FOR 2 WEEKS THEN AS NEEDED FOR SHORTNESS OF BREATH 07/25/20   [provider]  lidocaine  (LIDODERM ) 5 % APPLY 1 PATCH TO SKIN ONCE DAILY (APPLY FOR 12 HOURS, THEN REMOVE FOR 12 HOURS) 09/14/20   [provider]  ondansetron  (ZOFRAN ) 4 MG tablet Take 1 tablet (4 mg total) by mouth every 6 (six) hours as needed for up to 20 doses for nausea or vomiting. 12/22/23   Rising, Ivette Marks, [name]  POLYETHYLENE GLYCOL 3350 PO Take by mouth. 09/17/23   [provider]  tamsulosin  (FLOMAX ) 0.4 MG CAPS capsule Take 1 capsule by mouth at bedtime. 01/16/21   [provider]  Tiotropium Bromide Monohydrate 1.25 MCG/ACT AERS INHALE 2 PUFFS BY MOUTH ONCE A DAY 09/24/20   [provider]  tiZANidine  (ZANAFLEX ) 4 MG tablet Take 1-2 tablets (4-8 mg total) by mouth every 6 (six) hours as needed for muscle spasms. 12/24/19   Stephany Ehrich, MD  traMADol  (ULTRAM ) 50 MG tablet Take 1 tablet (50 mg  total) by mouth every 8 (eight) hours as needed for severe pain (pain score 7-10). 12/20/23   Murrill, Samantha, [name]      Allergies    Patient has no known allergies.    Review of Systems   Review of Systems  Physical Exam Updated Vital Signs BP 117/84   Pulse (!) 57   Temp 97.9 F (36.6 C)   Resp 18   Ht 1.803 m (5\' 11" )   Wt 74.8 kg   SpO2 100%   BMI 23.01 kg/m  Physical Exam CONSTITUTIONAL: Well developed/well nourished HEAD: Normocephalic/atraumatic NEURO: Pt is awake/alert/appropriate, moves all extremitiesx4.  No facial droop.   EXTREMITIES: pulses normal/equal, full ROM Distal pulses equal and intact.  There is no lower extremity edema.  Small ulcer noted on the right medial malleolus.  No crepitus, no bleeding.  Full range of motion of the right ankle SKIN: warm, see photo PSYCH: no abnormalities of mood noted, alert and oriented to situation    ED Results / Procedures / Treatments   Labs (all labs ordered are listed, but only abnormal results are displayed) Labs Reviewed  CBG MONITORING, ED    EKG None  Radiology No results found.  Procedures Procedures    Medications Ordered in ED Medications  ibuprofen (ADVIL) tablet 400 mg (has no administration in time range)    ED Course/ Medical Decision Making/  A&P                                 Medical Decision Making Risk Prescription drug management.   Patient presents with venous stasis ulcer of the right ankle.  He has been seen for this previously and already been on antibiotics When comparing the wound to previous photos, there is been minimal change.  Will provide wound care and [facility]       Final Clinical Impression(s) / ED Diagnoses Final diagnoses:  Chronic ulcer of left leg, limited to breakdown of skin [facility])    Rx / DC Orders ED Discharge Orders     None         Eldon Greenland, MD 01/10/24 [phone]

## 2024-01-10 NOTE — ED Notes (Signed)
 Patient ambulated to bathroom with no issues or complaints.

## 2024-01-10 NOTE — ED Notes (Signed)
 Wound cleaned with NS, covered with bacitracin, normal gauze, and an ACE wrap.

## 2024-01-13 ENCOUNTER — Encounter

## 2024-01-25 ENCOUNTER — Encounter (HOSPITAL_COMMUNITY): Payer: Self-pay

## 2024-01-25 ENCOUNTER — Ambulatory Visit (HOSPITAL_COMMUNITY): Admission: EM | Admit: 2024-01-25 | Discharge: 2024-01-25 | Disposition: A | Discharge status: Home / Self Care

## 2024-01-25 DIAGNOSIS — I83013 Varicose veins of right lower extremity with ulcer of ankle: Secondary | ICD-10-CM | POA: Diagnosis not present

## 2024-01-25 DIAGNOSIS — L97311 Non-pressure chronic ulcer of right ankle limited to breakdown of skin: Secondary | ICD-10-CM

## 2024-01-25 MED ORDER — LIDOCAINE HCL 2 % IJ SOLN
5.0000 mL | Freq: Once | INTRAMUSCULAR | Status: AC
  Administered 2024-01-25: 100 mg

## 2024-01-25 MED ORDER — LIDOCAINE 4 % EX OINT: 1.0000 | TOPICAL_OINTMENT | Freq: Two times a day (BID) | CUTANEOUS | 0 refills | Status: AC | PRN

## 2024-01-25 MED ORDER — DICLOFENAC SODIUM 50 MG PO TBEC: 50.0000 mg | DELAYED_RELEASE_TABLET | Freq: Two times a day (BID) | ORAL | 1 refills | Status: DC

## 2024-01-25 MED ORDER — DOXYCYCLINE HYCLATE 100 MG PO CAPS: 100.0000 mg | ORAL_CAPSULE | Freq: Two times a day (BID) | ORAL | 0 refills | Status: AC

## 2024-01-25 MED ORDER — BACITRACIN ZINC 500 UNIT/GM EX OINT
TOPICAL_OINTMENT | CUTANEOUS | Status: AC
  Filled 2024-01-25: qty 0.9

## 2024-01-25 MED ORDER — BACITRACIN ZINC 500 UNIT/GM EX OINT
TOPICAL_OINTMENT | Freq: Once | CUTANEOUS | Status: AC
  Administered 2024-01-25: 1 via TOPICAL

## 2024-01-25 MED ORDER — LIDOCAINE HCL (PF) 2 % IJ SOLN
INTRAMUSCULAR | Status: AC
Start: 2024-01-25 — End: 2024-01-25
  Filled 2024-01-25: qty 5

## 2024-01-25 NOTE — ED Triage Notes (Signed)
 Patient reports that he has a right medial ankle ulcer that he has had x 2-3 weeks. Patient was seen on 5/30 in the ED for the sme.   Patient states he is still having pain. Patient states he thinks he has an appointment with his primary on 6/17 or 02/02/24.

## 2024-01-25 NOTE — ED Provider Notes (Signed)
 [facility]  Note:  This document was prepared using Dragon voice recognition software and may include unintentional dictation errors.  MRN: [id number] DOB: [birth year]  Subjective:   Jordan Nelson is a 73 y.o. male presenting for a chronic ulcer to the right ankle x 2 to 3 weeks secondary to venous stasis.  Patient reports that he was seen at the end of May in the ER for the same thing and is still having pain.  Patient has follow-up with primary care later this morning but is here due to increased pain and concern for infection.  No fever, shortness of breath, chest pain, weakness, dizziness.   Current Facility-Administered Medications:    bacitracin ointment, , Topical, Once, Joene Gelder B, NP   lidocaine  (XYLOCAINE ) 2 % (with pres) injection 100 mg, 5 mL, Other, Once, Quavis Klutz B, NP  Current Outpatient Medications:    diclofenac (VOLTAREN) 50 MG EC tablet, Take 1 tablet (50 mg total) by mouth 2 (two) times daily., Disp: 30 tablet, Rfl: 1   doxycycline (VIBRAMYCIN) 100 MG capsule, Take 1 capsule (100 mg total) by mouth 2 (two) times daily for 7 days., Disp: 14 capsule, Rfl: 0   Lidocaine  4 % OINT, Apply 1 application  topically 2 (two) times daily as needed., Disp: 50 g, Rfl: 0   DULoxetine (CYMBALTA) 30 MG capsule, Take 1 capsule by mouth daily., Disp: , Rfl:    finasteride  (PROSCAR ) 5 MG tablet, Take 1 tablet by mouth daily., Disp: , Rfl:    Ipratropium-Albuterol (COMBIVENT) 20-100 MCG/ACT AERS respimat, INHALE 1 PUFF BY MOUTH FOUR TIMES A DAY AS NEEDED - USE CONTINUOUSLY FOR 2 WEEKS THEN AS NEEDED FOR SHORTNESS OF BREATH, Disp: , Rfl:    lidocaine  (LIDODERM ) 5 %, APPLY 1 PATCH TO SKIN ONCE DAILY (APPLY FOR 12 HOURS, THEN REMOVE FOR 12 HOURS), Disp: , Rfl:    ondansetron  (ZOFRAN ) 4 MG tablet, Take 1 tablet (4 mg total) by mouth every 6 (six) hours as needed for up to 20 doses for nausea or vomiting., Disp: 12 tablet, Rfl: 0   POLYETHYLENE GLYCOL 3350 PO,  Take by mouth., Disp: , Rfl:    tamsulosin  (FLOMAX ) 0.4 MG CAPS capsule, Take 1 capsule by mouth at bedtime., Disp: , Rfl:    Tiotropium Bromide Monohydrate 1.25 MCG/ACT AERS, INHALE 2 PUFFS BY MOUTH ONCE A DAY, Disp: , Rfl:    tiZANidine  (ZANAFLEX ) 4 MG tablet, Take 1-2 tablets (4-8 mg total) by mouth every 6 (six) hours as needed for muscle spasms., Disp: 30 tablet, Rfl: 1   traMADol  (ULTRAM ) 50 MG tablet, Take 1 tablet (50 mg total) by mouth every 8 (eight) hours as needed for severe pain (pain score 7-10)., Disp: 12 tablet, Rfl: 0   No Known Allergies  Past Medical History:  Diagnosis Date   Arthritis    Benign enlargement of prostate    Chronic back pain    Epigastric hernia    GERD (gastroesophageal reflux disease)    Numbness    right hand   Pollen allergy    Scoliosis     minor   Tuberculosis    early 1980's   Venous insufficiency (chronic) (peripheral)    Wears glasses      Past Surgical History:  Procedure Laterality Date   EYE SURGERY     HERNIA REPAIR     UMBILICAL HERNIA REPAIR N/A 04/10/2022   Procedure: OPEN UMBILICAL HERNIA REPAIR WITH MESH;  Surgeon: Oza Blumenthal, MD;  Location: [facility];  Service: General;  Laterality: N/A;    Family History  Problem Relation Age of Onset   Hypertension Mother     Social History   Tobacco Use   Smoking status: Former    Types: Cigarettes    Passive exposure: Never   Smokeless tobacco: Never  Vaping Use   Vaping status: Never Used  Substance Use Topics   Alcohol use: No   Drug use: No    ROS Refer to HPI for ROS details.  Objective:   Vitals: BP 116/79 (BP Location: Right Arm)   Pulse 74   Temp 98 F (36.7 C) (Oral)   Resp 16   SpO2 98%   Physical Exam Vitals and nursing note reviewed.  Constitutional:      General: He is not in acute distress.    Appearance: Normal appearance. He is not ill-appearing or toxic-appearing.  HENT:     Head: Normocephalic.   Cardiovascular:      Rate and Rhythm: Normal rate.  Pulmonary:     Effort: Pulmonary effort is normal. No respiratory distress.   Skin:    General: Skin is warm and dry.     Capillary Refill: Capillary refill takes less than 2 seconds.     Findings: Erythema and wound (Grade 1 skin ulcer to the right medial ankle, mild surrounding erythema, minimal swelling, mild serosanguineous drainage.) present.   Neurological:     General: No focal deficit present.     Mental Status: He is alert and oriented to person, place, and time.   Psychiatric:        Mood and Affect: Mood normal.        Behavior: Behavior normal.     Procedures  No results found for this or any previous visit (from the past 24 hours).  No results found.   Assessment and Plan :     Discharge Instructions       1. Venous stasis ulcer of right ankle limited to breakdown of skin with varicose veins (HCC) (Primary) - Lidocaine  4 % OINT; Apply 1 application  topically 2 (two) times daily as needed for pain to the wound.  Apply ointment and leave in place for approximately 15 minutes then wiped clean and apply antibiotic ointment that you were previously prescribed. - doxycycline (VIBRAMYCIN) 100 MG capsule; Take 1 capsule (100 mg total) by mouth 2 (two) times daily for 7 days for localized infection. - diclofenac (VOLTAREN) 50 MG EC tablet; Take 1 tablet (50 mg total) by mouth 2 (two) times daily for pain and inflammation - bacitracin ointment applied to wound in urgent care with dressing for infection prevention and improved wound healing. - lidocaine  (XYLOCAINE ) 2 % 100 mg 5 mL applied externally to skin surface for local anesthesia - Apply dressing in UC for protection of wound until follow-up evaluation -Continue to monitor symptoms for any change in severity if there is any escalation of current symptoms or development of new symptoms follow-up in ER or Urgent Care for further evaluation and management.      Elden Brucato B  Hulet Ehrmann   Elasha Tess, [place] B, [place] 01/25/24 1756

## 2024-01-25 NOTE — Discharge Instructions (Addendum)
  1. Venous stasis ulcer of right ankle limited to breakdown of skin with varicose veins (HCC) (Primary) - Lidocaine  4 % OINT; Apply 1 application  topically 2 (two) times daily as needed for pain to the wound.  Apply ointment and leave in place for approximately 15 minutes then wiped clean and apply antibiotic ointment that you were previously prescribed. - doxycycline (VIBRAMYCIN) 100 MG capsule; Take 1 capsule (100 mg total) by mouth 2 (two) times daily for 7 days for localized infection. - diclofenac (VOLTAREN) 50 MG EC tablet; Take 1 tablet (50 mg total) by mouth 2 (two) times daily for pain and inflammation - bacitracin ointment applied to wound in urgent care with dressing for infection prevention and improved wound healing. - lidocaine  (XYLOCAINE ) 2 % 100 mg 5 mL applied externally to skin surface for local anesthesia - Apply dressing in UC for protection of wound until follow-up evaluation -Continue to monitor symptoms for any change in severity if there is any escalation of current symptoms or development of new symptoms follow-up in ER or Urgent Care for further evaluation and management.

## 2024-01-29 ENCOUNTER — Telehealth (HOSPITAL_COMMUNITY): Payer: Self-pay

## 2024-01-29 NOTE — Telephone Encounter (Signed)
 Informed that Patient calling in: States he was prescribed some lidocaine  patches and his insurance won't cover them. The pharmacist advised he use topical Aspercreme with 4% lido. He wants to know if it's ok?   Patient informed that this is an okay alternative and verbalized understanding.

## 2024-02-02 ENCOUNTER — Encounter: Admitting: Gastroenterology

## 2024-02-12 ENCOUNTER — Encounter (HOSPITAL_BASED_OUTPATIENT_CLINIC_OR_DEPARTMENT_OTHER): Attending: General Surgery | Admitting: General Surgery

## 2024-02-12 DIAGNOSIS — L97312 Non-pressure chronic ulcer of right ankle with fat layer exposed: Secondary | ICD-10-CM | POA: Diagnosis not present

## 2024-02-12 DIAGNOSIS — I872 Venous insufficiency (chronic) (peripheral): Secondary | ICD-10-CM | POA: Diagnosis not present

## 2024-02-20 ENCOUNTER — Encounter (HOSPITAL_BASED_OUTPATIENT_CLINIC_OR_DEPARTMENT_OTHER): Admitting: General Surgery

## 2024-02-20 ENCOUNTER — Ambulatory Visit (HOSPITAL_BASED_OUTPATIENT_CLINIC_OR_DEPARTMENT_OTHER): Admitting: Internal Medicine

## 2024-02-20 DIAGNOSIS — I872 Venous insufficiency (chronic) (peripheral): Secondary | ICD-10-CM | POA: Diagnosis not present

## 2024-02-20 DIAGNOSIS — L97312 Non-pressure chronic ulcer of right ankle with fat layer exposed: Secondary | ICD-10-CM | POA: Diagnosis not present

## 2024-02-27 ENCOUNTER — Encounter (HOSPITAL_BASED_OUTPATIENT_CLINIC_OR_DEPARTMENT_OTHER): Admitting: General Surgery

## 2024-02-27 DIAGNOSIS — L97312 Non-pressure chronic ulcer of right ankle with fat layer exposed: Secondary | ICD-10-CM | POA: Diagnosis not present

## 2024-04-04 ENCOUNTER — Ambulatory Visit (HOSPITAL_COMMUNITY): Admission: EM | Admit: 2024-04-04 | Discharge: 2024-04-04 | Disposition: A | Discharge status: Home / Self Care

## 2024-04-04 ENCOUNTER — Encounter (HOSPITAL_COMMUNITY): Payer: Self-pay

## 2024-04-04 DIAGNOSIS — B3749 Other urogenital candidiasis: Secondary | ICD-10-CM

## 2024-04-04 DIAGNOSIS — N4889 Other specified disorders of penis: Secondary | ICD-10-CM | POA: Diagnosis not present

## 2024-04-04 MED ORDER — NYSTATIN 100000 UNIT/GM EX CREA: TOPICAL_CREAM | CUTANEOUS | 0 refills | Status: DC

## 2024-04-04 MED ORDER — FLUCONAZOLE 150 MG PO TABS: 150.0000 mg | ORAL_TABLET | Freq: Every day | ORAL | 0 refills | Status: AC

## 2024-04-04 NOTE — ED Provider Notes (Signed)
 [facility]    CSN: [id number] Arrival date & time: 04/04/24  1551      History   Chief Complaint No chief complaint on file.   HPI Jordan Nelson is a 73 y.o. male.   73 year old male who presents urgent care with complaints of skin irritation and cottage cheese slight discharge on the penis.  He reports this started several days ago but he noticed the skin irritation last night.  He has had this in the past and was diagnosed with a yeast infection.  He has been noticing some whitish discharge coming out of the skin in the skin fold.  He is circumcised.  He denies any fevers, chills, dysuria, hematuria, discharge from the urethra, concerns for STD.     Past Medical History:  Diagnosis Date   Arthritis    Benign enlargement of prostate    Chronic back pain    Epigastric hernia    GERD (gastroesophageal reflux disease)    Numbness    right hand   Pollen allergy    Scoliosis     minor   Tuberculosis    early 1980's   Venous insufficiency (chronic) (peripheral)    Wears glasses     Patient Active Problem List   Diagnosis Date Noted   Incarcerated ventral hernia 04/10/2022   Anemia, unspecified 06/14/2021   Corn of foot 06/14/2021   Carpal tunnel syndrome 06/14/2021   Epistaxis 06/14/2021   Right sided sciatica 06/14/2021   Age-related nuclear cataract, bilateral 06/14/2021   Benign prostatic hyperplasia with lower urinary tract symptoms 06/14/2021   Centrilobular emphysema (HCC) 06/14/2021   Decreased Khalilah Hoke blood cell count, unspecified 06/14/2021   Edema, unspecified 06/14/2021   Low back pain, unspecified 06/14/2021   Other specified counseling 06/14/2021   Sebaceous cyst 06/14/2021   Varicose veins of unspecified lower extremity with inflammation 06/14/2021   Venous insufficiency (chronic) (peripheral) 06/14/2021   Elevated blood-pressure reading, without diagnosis of hypertension 10/17/2020   Tinea pedis, recurrent 12/24/2019   Onychomycosis  of toenail 12/24/2019   Scoliosis of lumbar spine 12/24/2019   Degenerative joint disease (DJD) of lumbar spine 12/24/2019    Past Surgical History:  Procedure Laterality Date   EYE SURGERY     HERNIA REPAIR     UMBILICAL HERNIA REPAIR N/A 04/10/2022   Procedure: OPEN UMBILICAL HERNIA REPAIR WITH MESH;  Surgeon: Vernetta Berg, MD;  Location: [facility];  Service: General;  Laterality: N/A;       Home Medications    Prior to Admission medications   Medication Sig Start Date End Date Taking? Authorizing Provider  clindamycin (CLINDAGEL) 1 % gel Apply 1 Application topically 2 (two) times daily.   Yes [provider]  fluconazole  (DIFLUCAN ) 150 MG tablet Take 1 tablet (150 mg total) by mouth daily for 2 days. take 1 tablet now and then repeat in 3 days 04/04/24 04/06/24 Yes [name], [name]  lactobacillus acidophilus (BACID) TABS tablet Take 2 tablets by mouth 3 (three) times daily.   Yes [provider]  nystatin  cream (MYCOSTATIN ) Apply to affected area 2 times daily 04/04/24  Yes Demetruis Depaul A, [name]  pantoprazole (PROTONIX) 20 MG tablet Take 20 mg by mouth daily.   Yes [provider]  diclofenac  (VOLTAREN ) 50 MG EC tablet Take 1 tablet (50 mg total) by mouth 2 (two) times daily. 01/25/24   [name], [name], NP  DULoxetine (CYMBALTA) 30 MG capsule Take 1 capsule by mouth daily. 10/27/20  [provider]  finasteride  (PROSCAR ) 5 MG tablet Take 1 tablet by mouth daily. 07/25/20   [provider]  Ipratropium-Albuterol (COMBIVENT) 20-100 MCG/ACT AERS respimat INHALE 1 PUFF BY MOUTH FOUR TIMES A DAY AS NEEDED - USE CONTINUOUSLY FOR 2 WEEKS THEN AS NEEDED FOR SHORTNESS OF BREATH 07/25/20   [provider]  lidocaine  (LIDODERM ) 5 % APPLY 1 PATCH TO SKIN ONCE DAILY (APPLY FOR 12 HOURS, THEN REMOVE FOR 12 HOURS) 09/14/20   [provider]  Lidocaine  4 % OINT Apply 1 application  topically 2 (two) times  daily as needed. 01/25/24   [name], [name], NP  ondansetron  (ZOFRAN ) 4 MG tablet Take 1 tablet (4 mg total) by mouth every 6 (six) hours as needed for up to 20 doses for nausea or vomiting. Patient not taking: Reported on 04/04/2024 12/22/23   Rising, Rebecca, [name]  POLYETHYLENE GLYCOL 3350 PO Take by mouth. 09/17/23   [provider]  tamsulosin  (FLOMAX ) 0.4 MG CAPS capsule Take 1 capsule by mouth at bedtime. 01/16/21   [provider]  Tiotropium Bromide Monohydrate 1.25 MCG/ACT AERS INHALE 2 PUFFS BY MOUTH ONCE A DAY 09/24/20   [provider]  tiZANidine  (ZANAFLEX ) 4 MG tablet Take 1-2 tablets (4-8 mg total) by mouth every 6 (six) hours as needed for muscle spasms. 12/24/19   Maranda Jamee Jacob, MD  traMADol  (ULTRAM ) 50 MG tablet Take 1 tablet (50 mg total) by mouth every 8 (eight) hours as needed for severe pain (pain score 7-10). Patient not taking: Reported on 04/04/2024 12/20/23   [name], [name]    Family History Family History  Problem Relation Age of Onset   Hypertension Mother     Social History Social History   Tobacco Use   Smoking status: Former    Types: Cigarettes    Passive exposure: Never   Smokeless tobacco: Never  Vaping Use   Vaping status: Never Used  Substance Use Topics   Alcohol use: No   Drug use: No     Allergies   Patient has no known allergies.   Review of Systems Review of Systems  Constitutional:  Negative for chills and fever.  HENT:  Negative for ear pain and sore throat.   Eyes:  Negative for pain and visual disturbance.  Respiratory:  Negative for cough and shortness of breath.   Cardiovascular:  Negative for chest pain and palpitations.  Gastrointestinal:  Negative for abdominal pain and vomiting.  Genitourinary:  Positive for penile discharge and penile pain. Negative for dysuria and hematuria.  Musculoskeletal:  Negative for arthralgias and back pain.  Skin:  Negative for color change and rash.   Neurological:  Negative for seizures and syncope.  All other systems reviewed and are negative.    Physical Exam Triage Vital Signs ED Triage Vitals  Encounter Vitals Group     BP 04/04/24 1638 137/87     Girls Systolic BP Percentile --      Girls Diastolic BP Percentile --      Boys Systolic BP Percentile --      Boys Diastolic BP Percentile --      Pulse Rate 04/04/24 1638 76     Resp 04/04/24 1638 16     Temp 04/04/24 1638 98.2 F (36.8 C)     Temp Source 04/04/24 1638 Oral     SpO2 04/04/24 1638 100 %     Weight --      Height --  Head Circumference --      Peak Flow --      Pain Score 04/04/24 1640 7     Pain Loc --      Pain Education --      Exclude from Growth Chart --    No data found.  Updated Vital Signs BP 137/87 (BP Location: Right Arm)   Pulse 76   Temp 98.2 F (36.8 C) (Oral)   Resp 16   SpO2 100%   Visual Acuity Right Eye Distance:   Left Eye Distance:   Bilateral Distance:    Right Eye Near:   Left Eye Near:    Bilateral Near:     Physical Exam Vitals and nursing note reviewed.  Constitutional:      General: He is not in acute distress.    Appearance: He is well-developed.  HENT:     Head: Normocephalic and atraumatic.  Eyes:     Conjunctiva/sclera: Conjunctivae normal.  Cardiovascular:     Rate and Rhythm: Normal rate and regular rhythm.     Heart sounds: No murmur heard. Pulmonary:     Effort: Pulmonary effort is normal. No respiratory distress.     Breath sounds: Normal breath sounds.  Abdominal:     Palpations: Abdomen is soft.     Tenderness: There is no abdominal tenderness.  Genitourinary:    Penis: Erythema (Consistent with cutaneous yeast) present.     Musculoskeletal:        General: No swelling.     Cervical back: Neck supple.  Skin:    General: Skin is warm and dry.     Capillary Refill: Capillary refill takes less than 2 seconds.  Neurological:     Mental Status: He is alert.  Psychiatric:        Mood  and Affect: Mood normal.      UC Treatments / Results  Labs (all labs ordered are listed, but only abnormal results are displayed) Labs Reviewed - No data to display  EKG   Radiology No results found.  Procedures Procedures (including critical care time)  Medications Ordered in UC Medications - No data to display  Initial Impression / Assessment and Plan / UC Course  I have reviewed the triage vital signs and the nursing notes.  Pertinent labs & imaging results that were available during my care of the patient were reviewed by me and considered in my medical decision making (see chart for details).     Yeast dermatitis of penis  Sebaceous cyst of penis   Symptoms and physical exam findings are consistent with a yeast infection on the skin of the penis.  The area on the anterior aspect of the penis may be a small cyst given the physical exam findings.  May want to follow-up with urology to have them evaluate this.  We will treat with the following: Diflucan  150 mg take 1 tablet now and then repeat in 3 days.  Nystatin  cream topically 2-3 times daily until resolution of the rash Call urology to schedule an appointment earlier to be seen and have them evaluate the area on the penis as this may be a sebaceous cyst. Return to urgent care or PCP if symptoms worsen or fail to resolve.    Final Clinical Impressions(s) / UC Diagnoses   Final diagnoses:  Yeast dermatitis of penis  Sebaceous cyst of penis     Discharge Instructions      Symptoms and physical exam findings are consistent with a  yeast infection on the skin of the penis.  The area on the anterior aspect of the penis may be a small cyst given the physical exam findings.  May want to follow-up with urology to have them evaluate this.  We will treat with the following: Diflucan  150 mg take 1 tablet now and then repeat in 3 days.  Nystatin  cream topically 2-3 times daily until resolution of the rash Call urology to  schedule an appointment earlier to be seen and have them evaluate the area on the penis as this may be a sebaceous cyst. Return to urgent care or PCP if symptoms worsen or fail to resolve.      ED Prescriptions     Medication Sig Dispense Auth. Provider   fluconazole  (DIFLUCAN ) 150 MG tablet Take 1 tablet (150 mg total) by mouth daily for 2 days. take 1 tablet now and then repeat in 3 days 2 tablet Tylea Hise A, [name]   nystatin  cream (MYCOSTATIN ) Apply to affected area 2 times daily 30 g Teresa Almarie LABOR, [name]      PDMP not reviewed this encounter.   Teresa Almarie LABOR, [place] 04/04/24 1726

## 2024-04-04 NOTE — ED Triage Notes (Signed)
 Patient states he has a cottage cheese area to the penis and when he cleans it he has redness and soreness x 2 weeks.

## 2024-04-04 NOTE — Discharge Instructions (Addendum)
 Symptoms and physical exam findings are consistent with a yeast infection on the skin of the penis.  The area on the anterior aspect of the penis may be a small cyst given the physical exam findings.  May want to follow-up with urology to have them evaluate this.  We will treat with the following: Diflucan  150 mg take 1 tablet now and then repeat in 3 days.  Nystatin  cream topically 2-3 times daily until resolution of the rash Call urology to schedule an appointment earlier to be seen and have them evaluate the area on the penis as this may be a sebaceous cyst. Return to urgent care or PCP if symptoms worsen or fail to resolve.

## 2024-04-12 ENCOUNTER — Ambulatory Visit (HOSPITAL_COMMUNITY): Admission: EM | Admit: 2024-04-12 | Discharge: 2024-04-12 | Disposition: A | Discharge status: Home / Self Care

## 2024-04-12 ENCOUNTER — Encounter (HOSPITAL_COMMUNITY): Payer: Self-pay | Admitting: *Deleted

## 2024-04-12 DIAGNOSIS — M62838 Other muscle spasm: Secondary | ICD-10-CM

## 2024-04-12 DIAGNOSIS — K3184 Gastroparesis: Secondary | ICD-10-CM

## 2024-04-12 DIAGNOSIS — M549 Dorsalgia, unspecified: Secondary | ICD-10-CM | POA: Diagnosis not present

## 2024-04-12 NOTE — Discharge Instructions (Addendum)
 Try some of the attached exercises for back pain (carefully!)  Use hot pad on the back  Apply lidocaine  patch to the back  Follow up with [facility] at your visit next week  [facility]

## 2024-04-12 NOTE — ED Provider Notes (Signed)
 [facility]    CSN: [id number] Arrival date & time: 04/12/24  1631     History   Chief Complaint Chief Complaint  Patient presents with   Back Pain    HPI Jordan Nelson is a 73 y.o. male.  Here with intermittent left upper back pain, for the last few days It only hurts if he moves a specific way, or coughs/sneezes No pain at rest.  History of lumbar DJD, chronic back pain Has tizanidine  from the [place] but not using.  He has an appointment with [facility] in 1 week.   Also mentions 1 year of abdominal pain, thought to be gastroparesis. Followed by [facility]. He wants a second opinion. Has not taken the meds that were recommended.    No history of kidney disease. GFR was 86 in April , Creatinine 0.94  Past Medical History:  Diagnosis Date   Arthritis    Benign enlargement of prostate    Chronic back pain    Epigastric hernia    GERD (gastroesophageal reflux disease)    Numbness    right hand   Pollen allergy    Scoliosis     minor   Tuberculosis    early 1980's   Venous insufficiency (chronic) (peripheral)    Wears glasses     Patient Active Problem List   Diagnosis Date Noted   Gastroparesis    Incarcerated ventral hernia 04/10/2022   Anemia, unspecified 06/14/2021   Corn of foot 06/14/2021   Carpal tunnel syndrome 06/14/2021   Epistaxis 06/14/2021   Right sided sciatica 06/14/2021   Age-related nuclear cataract, bilateral 06/14/2021   Benign prostatic hyperplasia with lower urinary tract symptoms 06/14/2021   Centrilobular emphysema (HCC) 06/14/2021   Decreased white blood cell count, unspecified 06/14/2021   Edema, unspecified 06/14/2021   Low back pain, unspecified 06/14/2021   Other specified counseling 06/14/2021   Sebaceous cyst 06/14/2021   Varicose veins of unspecified lower extremity with inflammation 06/14/2021   Venous insufficiency (chronic) (peripheral) 06/14/2021   Elevated blood-pressure reading, without diagnosis of hypertension  10/17/2020   Tinea pedis, recurrent 12/24/2019   Onychomycosis of toenail 12/24/2019   Scoliosis of lumbar spine 12/24/2019   Degenerative joint disease (DJD) of lumbar spine 12/24/2019    Past Surgical History:  Procedure Laterality Date   EYE SURGERY     HERNIA REPAIR     UMBILICAL HERNIA REPAIR N/A 04/10/2022   Procedure: OPEN UMBILICAL HERNIA REPAIR WITH MESH;  Surgeon: Vernetta Berg, MD;  Location: [facility];  Service: General;  Laterality: N/A;       Home Medications    Prior to Admission medications   Medication Sig Start Date End Date Taking? Authorizing Provider  DULoxetine (CYMBALTA) 30 MG capsule Take 1 capsule by mouth daily. 10/27/20  Yes [provider]  finasteride  (PROSCAR ) 5 MG tablet Take 1 tablet by mouth daily. 07/25/20  Yes [provider]  lactobacillus acidophilus (BACID) TABS tablet Take 2 tablets by mouth 3 (three) times daily.   Yes [provider]  pantoprazole (PROTONIX) 20 MG tablet Take 20 mg by mouth daily.   Yes [provider]  tamsulosin  (FLOMAX ) 0.4 MG CAPS capsule Take 1 capsule by mouth at bedtime. 01/16/21  Yes [provider]  clindamycin (CLINDAGEL) 1 % gel Apply 1 Application topically 2 (two) times daily.    [provider]  diclofenac  (VOLTAREN ) 50 MG EC tablet Take 1 tablet (50 mg total) by mouth 2 (two) times daily.  01/25/24   [name], [name], NP  Ipratropium-Albuterol (COMBIVENT) 20-100 MCG/ACT AERS respimat INHALE 1 PUFF BY MOUTH FOUR TIMES A DAY AS NEEDED - USE CONTINUOUSLY FOR 2 WEEKS THEN AS NEEDED FOR SHORTNESS OF BREATH 07/25/20   [provider]  lidocaine  (LIDODERM ) 5 % APPLY 1 PATCH TO SKIN ONCE DAILY (APPLY FOR 12 HOURS, THEN REMOVE FOR 12 HOURS) 09/14/20   [provider]  Lidocaine  4 % OINT Apply 1 application  topically 2 (two) times daily as needed. 01/25/24   [name], [name], NP  nystatin  cream (MYCOSTATIN ) Apply to affected area 2  times daily 04/04/24   [name], [name]  ondansetron  (ZOFRAN ) 4 MG tablet Take 1 tablet (4 mg total) by mouth every 6 (six) hours as needed for up to 20 doses for nausea or vomiting. Patient not taking: Reported on 04/04/2024 12/22/23   Phil Michels, [name]  POLYETHYLENE GLYCOL 3350 PO Take by mouth. 09/17/23   [provider]  Tiotropium Bromide Monohydrate 1.25 MCG/ACT AERS INHALE 2 PUFFS BY MOUTH ONCE A DAY 09/24/20   [provider]  tiZANidine  (ZANAFLEX ) 4 MG tablet Take 1-2 tablets (4-8 mg total) by mouth every 6 (six) hours as needed for muscle spasms. 12/24/19   Maranda Jamee Jacob, MD  traMADol  (ULTRAM ) 50 MG tablet Take 1 tablet (50 mg total) by mouth every 8 (eight) hours as needed for severe pain (pain score 7-10). Patient not taking: Reported on 04/04/2024 12/20/23   [name], [name]    Family History Family History  Problem Relation Age of Onset   Hypertension Mother     Social History Social History   Tobacco Use   Smoking status: Former    Types: Cigarettes    Passive exposure: Never   Smokeless tobacco: Never  Vaping Use   Vaping status: Never Used  Substance Use Topics   Alcohol use: No   Drug use: No     Allergies   Patient has no known allergies.   Review of Systems Review of Systems As per HPI  Physical Exam Triage Vital Signs ED Triage Vitals  Encounter Vitals Group     BP 04/12/24 1720 120/73     Girls Systolic BP Percentile --      Girls Diastolic BP Percentile --      Boys Systolic BP Percentile --      Boys Diastolic BP Percentile --      Pulse Rate 04/12/24 1720 70     Resp 04/12/24 1720 18     Temp 04/12/24 1720 98.1 F (36.7 C)     Temp Source 04/12/24 1720 Oral     SpO2 04/12/24 1720 96 %     Weight --      Height --      Head Circumference --      Peak Flow --      Pain Score 04/12/24 1716 10     Pain Loc --      Pain Education --      Exclude from Growth Chart --    No data found.  Updated Vital  Signs BP 120/73 (BP Location: Right Arm)   Pulse 70   Temp 98.1 F (36.7 C) (Oral)   Resp 18   SpO2 96%    Physical Exam Vitals and nursing note reviewed.  Constitutional:      General: He is not in acute distress. HENT:     Mouth/Throat:     Mouth: Mucous membranes are moist.  Pharynx: Oropharynx is clear.  Eyes:     Extraocular Movements: Extraocular movements intact.     Conjunctiva/sclera: Conjunctivae normal.     Pupils: Pupils are equal, round, and reactive to light.  Cardiovascular:     Rate and Rhythm: Normal rate and regular rhythm.     Heart sounds: Normal heart sounds.  Pulmonary:     Effort: Pulmonary effort is normal.     Breath sounds: Normal breath sounds.  Musculoskeletal:        General: Normal range of motion.     Cervical back: Normal range of motion. No rigidity or tenderness.     Comments: No bony tenderness C-L spine. No muscular tenderness to palpation   Skin:    General: Skin is warm and dry.     Comments: No bruising, lesions, rash, erythema  Neurological:     General: No focal deficit present.     Mental Status: He is alert and oriented to person, place, and time.     Cranial Nerves: No cranial nerve deficit.     Sensory: Sensation is intact.     Motor: Motor function is intact. No weakness.     Coordination: Coordination is intact.     Gait: Gait is intact.     Deep Tendon Reflexes: Reflexes are normal and symmetric.     Comments: Strength 5/5. Sensation intact throughout      UC Treatments / Results  Labs (all labs ordered are listed, but only abnormal results are displayed) Labs Reviewed - No data to display  EKG   Radiology No results found.  Procedures Procedures (including critical care time)  Medications Ordered in UC Medications - No data to display  Initial Impression / Assessment and Plan / UC Course  I have reviewed the triage vital signs and the nursing notes.  Pertinent labs & imaging results that were  available during my care of the patient were reviewed by me and considered in my medical decision making (see chart for details).  No back pain currently Neurologically intact, oo red flags. Recommend follow up with ortho at visit next week Occurs only with specific movement. Discussed muscle spasm or strain.  No interventions attempted yet by patient, discussed treatments. Recommend hot pad, lidocaine  patches, gentle stretching. Can use [facility].   Advised [facility] regarding his chronic [facility] and his concern for a second opinion  Final Clinical Impressions(s) / UC Diagnoses   Final diagnoses:  Upper back pain on left side  Muscle spasm     Discharge Instructions      Try some of the attached exercises for back pain (carefully!)  Use hot pad on the back  Apply lidocaine  patch to the back  Follow up with [facility] at your visit next week  [facility] to discuss [facility] concerns      ED Prescriptions   None    PDMP not reviewed this encounter.   Jeryl Stabs, [place] 04/12/24 [id number]

## 2024-04-12 NOTE — ED Triage Notes (Signed)
 Pt states that he thinks he has a pulled muscle in his back, only hurts with movement and cough. Pt states that he has no pain in office today  He has stomach cramps X 1 year,  [facility], [facility], gastric empty scan. He has been dx with gastroparesis, per pt. but he wants another doctor to figure out what's wrong. He is on no meds now, but has been placed on many for his sx.

## 2024-06-06 ENCOUNTER — Emergency Department (HOSPITAL_COMMUNITY)
Admission: EM | Admit: 2024-06-06 | Discharge: 2024-06-06 | Disposition: A | Discharge status: Home / Self Care | Attending: Emergency Medicine | Admitting: Emergency Medicine

## 2024-06-06 ENCOUNTER — Encounter (HOSPITAL_COMMUNITY): Payer: Self-pay | Admitting: *Deleted

## 2024-06-06 ENCOUNTER — Other Ambulatory Visit: Payer: Self-pay

## 2024-06-06 ENCOUNTER — Emergency Department (HOSPITAL_COMMUNITY)

## 2024-06-06 ENCOUNTER — Ambulatory Visit (HOSPITAL_COMMUNITY)
Admission: EM | Admit: 2024-06-06 | Discharge: 2024-06-06 | Disposition: A | Discharge status: Home / Self Care | Attending: Family Medicine | Admitting: Family Medicine

## 2024-06-06 ENCOUNTER — Encounter (HOSPITAL_COMMUNITY): Payer: Self-pay | Admitting: Pharmacy Technician

## 2024-06-06 DIAGNOSIS — R079 Chest pain, unspecified: Secondary | ICD-10-CM

## 2024-06-06 DIAGNOSIS — R072 Precordial pain: Secondary | ICD-10-CM | POA: Insufficient documentation

## 2024-06-06 DIAGNOSIS — R42 Dizziness and giddiness: Secondary | ICD-10-CM | POA: Diagnosis not present

## 2024-06-06 DIAGNOSIS — R0789 Other chest pain: Secondary | ICD-10-CM | POA: Diagnosis present

## 2024-06-06 LAB — BASIC METABOLIC PANEL WITH GFR
Anion gap: 9 (ref 5–15)
BUN: 13 mg/dL (ref 8–23)
CO2: 28 mmol/L (ref 22–32)
Calcium: 8.9 mg/dL (ref 8.9–10.3)
Chloride: 101 mmol/L (ref 98–111)
Creatinine, Ser: 0.89 mg/dL (ref 0.61–1.24)
GFR, Estimated: >60 mL/min (ref >60)
Glucose, Bld: 78 mg/dL (ref 70–99)
Potassium: 4 mmol/L (ref 3.5–5.1)
Sodium: 138 mmol/L (ref 135–145)

## 2024-06-06 LAB — CBC WITH DIFFERENTIAL/PLATELET
Abs Immature Granulocytes: 0 K/uL (ref 0.00–0.07)
Basophils Absolute: 0 K/uL (ref 0.0–0.1)
Basophils Relative: 1 %
Eosinophils Absolute: 0.2 K/uL (ref 0.0–0.5)
Eosinophils Relative: 5 %
HCT: 32.7 % — ABNORMAL LOW (ref 39.0–52.0)
Hemoglobin: 10.7 g/dL — ABNORMAL LOW (ref 13.0–17.0)
Immature Granulocytes: 0 %
Lymphocytes Relative: 34 %
Lymphs Abs: 1 K/uL (ref 0.7–4.0)
MCH: 31.5 pg (ref 26.0–34.0)
MCHC: 32.7 g/dL (ref 30.0–36.0)
MCV: 96.2 fL (ref 80.0–100.0)
Monocytes Absolute: 0.4 K/uL (ref 0.1–1.0)
Monocytes Relative: 14 %
Neutro Abs: 1.3 K/uL — ABNORMAL LOW (ref 1.7–7.7)
Neutrophils Relative %: 46 %
Platelets: 166 K/uL (ref 150–400)
RBC: 3.4 MIL/uL — ABNORMAL LOW (ref 4.22–5.81)
RDW: 12.7 % (ref 11.5–15.5)
WBC: 2.9 K/uL — ABNORMAL LOW (ref 4.0–10.5)
nRBC: 0 % (ref 0.0–0.2)

## 2024-06-06 LAB — TROPONIN I (HIGH SENSITIVITY): Troponin I (High Sensitivity): 7 ng/L (ref <18)

## 2024-06-06 NOTE — Discharge Instructions (Addendum)
 Please go to the emergency department for further evaluation of your chest pain and dizziness.

## 2024-06-06 NOTE — Discharge Instructions (Signed)
 Workup here without any acute findings which is reassuring.  Would make an appointment to follow-up with cardiology.  They may consider doing an echocardiogram and a nonexercise stress test to further evaluate.  Definitely return for any chest pain that last 15 minutes or longer.

## 2024-06-06 NOTE — ED Provider Notes (Signed)
 [facility]    CSN: [id number] Arrival date & time: 06/06/24  1614      History   Chief Complaint Chief Complaint  Patient presents with   Chest Pain    HPI Jordan Nelson is a 73 y.o. male.   Patient presents with concerns for episode of left-sided chest pain that occurred this morning.  Patient states that prior to going to sleep around 4 AM this morning he had sharp 10 out of 10 left-sided chest pain.  Patient states that he was able to go to sleep despite having this pain and when he woke up he no longer had the chest pain.  Patient denies any more chest pain since then.  Patient states he has also had some intermittent dizziness over the last week or so as well.  Denies shortness of breath, recent cough or fever, weakness, numbness, tingling, nausea, vomiting, or diarrhea.  Patient states that he does have a history of ongoing left upper quadrant pain that he is being [facility] and reports that they have been unable to figure out what could be causing this.  Patient states that he does not think the chest pain is related to this.  Patient denies any history of cardiac issues.  Patient does have a history of GERD, but states that this pain felt different from this.  Patient reports that he was very concerned that this chest pain was related to his heart based on the location and the severity of his pain.  The history is provided by the patient and medical records.  Chest Pain   Past Medical History:  Diagnosis Date   Arthritis    Benign enlargement of prostate    Chronic back pain    Epigastric hernia    GERD (gastroesophageal reflux disease)    Numbness    right hand   Pollen allergy    Scoliosis     minor   Tuberculosis    early 1980's   Venous insufficiency (chronic) (peripheral)    Wears glasses     Patient Active Problem List   Diagnosis Date Noted   Gastroparesis    Incarcerated ventral hernia 04/10/2022    Anemia, unspecified 06/14/2021   Corn of foot 06/14/2021   Carpal tunnel syndrome 06/14/2021   Epistaxis 06/14/2021   Right sided sciatica 06/14/2021   Age-related nuclear cataract, bilateral 06/14/2021   Benign prostatic hyperplasia with lower urinary tract symptoms 06/14/2021   Centrilobular emphysema (HCC) 06/14/2021   Decreased white blood cell count, unspecified 06/14/2021   Edema, unspecified 06/14/2021   Low back pain, unspecified 06/14/2021   Other specified counseling 06/14/2021   Sebaceous cyst 06/14/2021   Varicose veins of unspecified lower extremity with inflammation 06/14/2021   Venous insufficiency (chronic) (peripheral) 06/14/2021   Elevated blood-pressure reading, without diagnosis of hypertension 10/17/2020   Tinea pedis, recurrent 12/24/2019   Onychomycosis of toenail 12/24/2019   Scoliosis of lumbar spine 12/24/2019   Degenerative joint disease (DJD) of lumbar spine 12/24/2019    Past Surgical History:  Procedure Laterality Date   EYE SURGERY     HERNIA REPAIR     UMBILICAL HERNIA REPAIR N/A 04/10/2022   Procedure: OPEN UMBILICAL HERNIA REPAIR WITH MESH;  Surgeon: Vernetta Berg, MD;  Location: [facility];  Service: General;  Laterality: N/A;       Home Medications    Prior to Admission medications   Medication Sig Start Date End Date Taking? Authorizing  Provider  tamsulosin  (FLOMAX ) 0.4 MG CAPS capsule Take 1 capsule by mouth at bedtime. 01/16/21  Yes [provider]  tiZANidine  (ZANAFLEX ) 4 MG tablet Take 1-2 tablets (4-8 mg total) by mouth every 6 (six) hours as needed for muscle spasms. 12/24/19  Yes Maranda Jamee Jacob, MD  clindamycin (CLINDAGEL) 1 % gel Apply 1 Application topically 2 (two) times daily.    [provider]  diclofenac  (VOLTAREN ) 50 MG EC tablet Take 1 tablet (50 mg total) by mouth 2 (two) times daily. 01/25/24   [name], [name], NP  DULoxetine (CYMBALTA) 30 MG capsule Take 1 capsule by mouth daily.  10/27/20   [provider]  finasteride  (PROSCAR ) 5 MG tablet Take 1 tablet by mouth daily. 07/25/20   [provider]  Ipratropium-Albuterol (COMBIVENT) 20-100 MCG/ACT AERS respimat INHALE 1 PUFF BY MOUTH FOUR TIMES A DAY AS NEEDED - USE CONTINUOUSLY FOR 2 WEEKS THEN AS NEEDED FOR SHORTNESS OF BREATH 07/25/20   [provider]  lactobacillus acidophilus (BACID) TABS tablet Take 2 tablets by mouth 3 (three) times daily.    [provider]  lidocaine  (LIDODERM ) 5 % APPLY 1 PATCH TO SKIN ONCE DAILY (APPLY FOR 12 HOURS, THEN REMOVE FOR 12 HOURS) 09/14/20   [provider]  Lidocaine  4 % OINT Apply 1 application  topically 2 (two) times daily as needed. 01/25/24   [name], [name], NP  nystatin  cream (MYCOSTATIN ) Apply to affected area 2 times daily 04/04/24   [name], [name]  ondansetron  (ZOFRAN ) 4 MG tablet Take 1 tablet (4 mg total) by mouth every 6 (six) hours as needed for up to 20 doses for nausea or vomiting. Patient not taking: Reported on 04/04/2024 12/22/23   Rising, [name], [name]  pantoprazole (PROTONIX) 20 MG tablet Take 20 mg by mouth daily.    [provider]  POLYETHYLENE GLYCOL 3350 PO Take by mouth. 09/17/23   [provider]  Tiotropium Bromide Monohydrate 1.25 MCG/ACT AERS INHALE 2 PUFFS BY MOUTH ONCE A DAY 09/24/20   [provider]  traMADol  (ULTRAM ) 50 MG tablet Take 1 tablet (50 mg total) by mouth every 8 (eight) hours as needed for severe pain (pain score 7-10). Patient not taking: Reported on 04/04/2024 12/20/23   [name], [name]    Family History Family History  Problem Relation Age of Onset   Hypertension Mother     Social History Social History   Tobacco Use   Smoking status: Former    Types: Cigarettes    Passive exposure: Never   Smokeless tobacco: Never  Vaping Use   Vaping status: Never Used  Substance Use Topics   Alcohol use: No   Drug use: No     Allergies   Patient  has no known allergies.   Review of Systems Review of Systems  Cardiovascular:  Positive for chest pain.   Per HPI  Physical Exam Triage Vital Signs ED Triage Vitals  Encounter Vitals Group     BP 06/06/24 1620 116/84     Girls Systolic BP Percentile --      Girls Diastolic BP Percentile --      Boys Systolic BP Percentile --      Boys Diastolic BP Percentile --      Pulse Rate 06/06/24 1620 89     Resp 06/06/24 1620 18     Temp 06/06/24 1620 98 F (36.7 C)     Temp src --      SpO2 06/06/24  1620 100 %     Weight --      Height --      Head Circumference --      Peak Flow --      Pain Score 06/06/24 1619 0     Pain Loc --      Pain Education --      Exclude from Growth Chart --    No data found.  Updated Vital Signs BP 116/84   Pulse 89   Temp 98 F (36.7 C)   Resp 18   SpO2 100%   Visual Acuity Right Eye Distance:   Left Eye Distance:   Bilateral Distance:    Right Eye Near:   Left Eye Near:    Bilateral Near:     Physical Exam Vitals and nursing note reviewed.  Constitutional:      General: He is awake. He is not in acute distress.    Appearance: Normal appearance. He is well-developed and well-groomed. He is not ill-appearing.  Cardiovascular:     Rate and Rhythm: Normal rate and regular rhythm.     Heart sounds: Normal heart sounds.  Pulmonary:     Effort: Pulmonary effort is normal.     Breath sounds: Normal breath sounds.  Chest:     Chest wall: No swelling, tenderness or edema.  Skin:    General: Skin is warm and dry.  Neurological:     General: No focal deficit present.     Mental Status: He is alert and oriented to person, place, and time. Mental status is at baseline.     GCS: GCS eye subscore is 4. GCS verbal subscore is 5. GCS motor subscore is 6.     Cranial Nerves: Cranial nerves 2-12 are intact.     Sensory: Sensation is intact.     Motor: Motor function is intact.     Coordination: Coordination is intact.     Gait: Gait is  intact.  Psychiatric:        Behavior: Behavior is cooperative.      UC Treatments / Results  Labs (all labs ordered are listed, but only abnormal results are displayed) Labs Reviewed - No data to display  EKG   Radiology No results found.  Procedures Procedures (including critical care time)  Medications Ordered in UC Medications - No data to display  Initial Impression / Assessment and Plan / UC Course  I have reviewed the triage vital signs and the nursing notes.  Pertinent labs & imaging results that were available during my care of the patient were reviewed by me and considered in my medical decision making (see chart for details).     Patient is overall well-appearing.  Vitals are stable.  No significant findings on assessment.  Heart and lung sounds normal.  GCS 15.  No neurodeficits noted on exam.  Nontender upon palpation to chest wall.  EKG reveals normal sinus rhythm without ST elevation or acute cardiac findings.  EKG appears to be similar to previous 1 done in 2018.  Recommended patient be seen in the emergency department for further evaluation of chest pain.  Patient is understanding and agreeable to plan at this time.  Patient is stable to arrive to the emergency department via POV. Final Clinical Impressions(s) / UC Diagnoses   Final diagnoses:  Chest pain, unspecified type  Dizziness     Discharge Instructions      Please go to the emergency department for further evaluation of your chest  pain and dizziness.     ED Prescriptions   None    PDMP not reviewed this encounter.   Johnie Flaming A, NP 06/06/24 1655

## 2024-06-06 NOTE — ED Triage Notes (Addendum)
 PT reports when he went to bed this morning he had CP. Pt denies CP at this time. PT reports sometimes he feels dizzy.

## 2024-06-06 NOTE — ED Triage Notes (Signed)
 PT arrives via POV. PT reports he had chest pain around 0400 this morning that last about 10 mins. PT reports intermittent light headedness as well. Pt arrives AxOx4.

## 2024-06-06 NOTE — ED Provider Notes (Addendum)
 [place] EMERGENCY DEPARTMENT AT [facility] Provider Note   CSN: [id number] Arrival date & time: 06/06/24  1658     Patient presents with: Chest Pain   Jordan Nelson is a 73 y.o. male.   Patient seen at urgent care.  Patient with acute onset at about 4:00 in the morning of the left anterior chest pain that was fairly severe fairly sharp that lasted approximately 10 minutes.  No reoccurrence.  Not associated with diaphoresis not associated with nausea vomiting or shortness of breath.  Past medical history significant for prediabetes.  No coronary artery disease history.  Patient went to urgent care and then promptly referred here for additional workup.  Patient has been completely asymptomatic since that time.       Prior to Admission medications   Medication Sig Start Date End Date Taking? Authorizing Provider  clindamycin (CLINDAGEL) 1 % gel Apply 1 Application topically 2 (two) times daily.    [provider]  diclofenac  (VOLTAREN ) 50 MG EC tablet Take 1 tablet (50 mg total) by mouth 2 (two) times daily. 01/25/24   [name], [name], NP  DULoxetine (CYMBALTA) 30 MG capsule Take 1 capsule by mouth daily. 10/27/20   [provider]  finasteride  (PROSCAR ) 5 MG tablet Take 1 tablet by mouth daily. 07/25/20   [provider]  Ipratropium-Albuterol (COMBIVENT) 20-100 MCG/ACT AERS respimat INHALE 1 PUFF BY MOUTH FOUR TIMES A DAY AS NEEDED - USE CONTINUOUSLY FOR 2 WEEKS THEN AS NEEDED FOR SHORTNESS OF BREATH 07/25/20   [provider]  lactobacillus acidophilus (BACID) TABS tablet Take 2 tablets by mouth 3 (three) times daily.    [provider]  lidocaine  (LIDODERM ) 5 % APPLY 1 PATCH TO SKIN ONCE DAILY (APPLY FOR 12 HOURS, THEN REMOVE FOR 12 HOURS) 09/14/20   [provider]  Lidocaine  4 % OINT Apply 1 application  topically 2 (two) times daily as needed. 01/25/24   [name], [name], NP  nystatin  cream (MYCOSTATIN ) Apply  to affected area 2 times daily 04/04/24   [name], [name]  ondansetron  (ZOFRAN ) 4 MG tablet Take 1 tablet (4 mg total) by mouth every 6 (six) hours as needed for up to 20 doses for nausea or vomiting. Patient not taking: Reported on 04/04/2024 12/22/23   Rising, [name], [name]  pantoprazole (PROTONIX) 20 MG tablet Take 20 mg by mouth daily.    [provider]  POLYETHYLENE GLYCOL 3350 PO Take by mouth. 09/17/23   [provider]  tamsulosin  (FLOMAX ) 0.4 MG CAPS capsule Take 1 capsule by mouth at bedtime. 01/16/21   [provider]  Tiotropium Bromide Monohydrate 1.25 MCG/ACT AERS INHALE 2 PUFFS BY MOUTH ONCE A DAY 09/24/20   [provider]  tiZANidine  (ZANAFLEX ) 4 MG tablet Take 1-2 tablets (4-8 mg total) by mouth every 6 (six) hours as needed for muscle spasms. 12/24/19   Maranda Jamee Jacob, MD  traMADol  (ULTRAM ) 50 MG tablet Take 1 tablet (50 mg total) by mouth every 8 (eight) hours as needed for severe pain (pain score 7-10). Patient not taking: Reported on 04/04/2024 12/20/23   Murrill, Samantha, [name]    Allergies: Patient has no known allergies.    Review of Systems  Constitutional:  Negative for chills and fever.  HENT:  Negative for ear pain and sore throat.   Eyes:  Negative for pain and visual disturbance.  Respiratory:  Negative for cough and shortness of breath.   Cardiovascular:  Positive for chest  pain. Negative for palpitations.  Gastrointestinal:  Negative for abdominal pain and vomiting.  Genitourinary:  Negative for dysuria and hematuria.  Musculoskeletal:  Negative for arthralgias and back pain.  Skin:  Negative for color change and rash.  Neurological:  Negative for seizures and syncope.  All other systems reviewed and are negative.   Updated Vital Signs BP 133/88   Pulse 82   Temp 98 F (36.7 C) (Oral)   Resp 17   SpO2 100%   Physical Exam Vitals and nursing note reviewed.  Constitutional:      General: He is not in acute  distress.    Appearance: Normal appearance. He is well-developed.  HENT:     Head: Normocephalic and atraumatic.  Eyes:     Extraocular Movements: Extraocular movements intact.     Conjunctiva/sclera: Conjunctivae normal.     Pupils: Pupils are equal, round, and reactive to light.  Cardiovascular:     Rate and Rhythm: Normal rate and regular rhythm.     Heart sounds: No murmur heard. Pulmonary:     Effort: Pulmonary effort is normal. No respiratory distress.     Breath sounds: Normal breath sounds.  Chest:     Chest wall: No tenderness.  Abdominal:     General: There is no distension.     Palpations: Abdomen is soft.     Tenderness: There is no abdominal tenderness.  Musculoskeletal:        General: No swelling.     Cervical back: Normal range of motion and neck supple.     Right lower leg: No edema.     Left lower leg: No edema.  Skin:    General: Skin is warm and dry.     Capillary Refill: Capillary refill takes less than 2 seconds.  Neurological:     General: No focal deficit present.     Mental Status: He is alert and oriented to person, place, and time.  Psychiatric:        Mood and Affect: Mood normal.     (all labs ordered are listed, but only abnormal results are displayed) Labs Reviewed  CBC WITH DIFFERENTIAL/PLATELET  BASIC METABOLIC PANEL WITH GFR  TROPONIN I (HIGH SENSITIVITY)    EKG: None  Radiology: No results found.   Procedures   Medications Ordered in the ED - No data to display                                  Medical Decision Making Amount and/or Complexity of Data Reviewed Labs: ordered. Radiology: ordered.  Patient with chest pain at about 4 in the morning.  Will get troponin should only need 1 troponin unless we get a marginal result.  Portable chest x-ray basic labs.  EKG without any acute findings.  Did raise some concern about septal infarct age undetermined.  Will see what we get on the labs and chest x-ray.  CBC white count  2.9 unchanged from previous.  Hemoglobin 10.7 platelets 166.  Basic metabolic panel normal including renal function.  Troponin 7 does not need delta troponin because the pain was at 4 in the morning.  So this 7 is very reassuring.  And chest x-ray without any acute process.  Will give patient outpatient follow-up with cardiology.  Have him return for any new or worse symptoms.   Final diagnoses:  Precordial pain    ED Discharge Orders  None          Geraldene Hamilton, MD 06/06/24 [facility]    Saba Gomm, MD 06/06/24 2041

## 2024-06-14 ENCOUNTER — Ambulatory Visit (HOSPITAL_COMMUNITY): Admission: EM | Admit: 2024-06-14 | Discharge: 2024-06-14 | Disposition: A | Discharge status: Home / Self Care

## 2024-06-14 ENCOUNTER — Encounter (HOSPITAL_COMMUNITY): Payer: Self-pay | Admitting: Emergency Medicine

## 2024-06-14 DIAGNOSIS — L819 Disorder of pigmentation, unspecified: Secondary | ICD-10-CM

## 2024-06-14 NOTE — ED Triage Notes (Signed)
 Reports noticed lighter colored skin on chest for couple days, never been this light before.

## 2024-06-14 NOTE — ED Provider Notes (Signed)
 [facility]    CSN: [id number] Arrival date & time: 06/14/24  1638      History   Chief Complaint Chief Complaint  Patient presents with   Skin Discoloration    HPI Jordan Nelson is a 73 y.o. male.   Patient presents with concern for skin discoloration to his chest that he noticed 2 days ago.  Patient states that it appears like the skin in the middle of his chest is lighter than it has been before.  Patient denies any itching or pain to this area. Patient denies any rash or drainage from this area.  The history is provided by the patient and medical records.    Past Medical History:  Diagnosis Date   Arthritis    Benign enlargement of prostate    Chronic back pain    Epigastric hernia    GERD (gastroesophageal reflux disease)    Numbness    right hand   Pollen allergy    Scoliosis     minor   Tuberculosis    early 1980's   Venous insufficiency (chronic) (peripheral)    Wears glasses     Patient Active Problem List   Diagnosis Date Noted   Gastroparesis    Incarcerated ventral hernia 04/10/2022   Anemia, unspecified 06/14/2021   Corn of foot 06/14/2021   Carpal tunnel syndrome 06/14/2021   Epistaxis 06/14/2021   Right sided sciatica 06/14/2021   Age-related nuclear cataract, bilateral 06/14/2021   Benign prostatic hyperplasia with lower urinary tract symptoms 06/14/2021   Centrilobular emphysema (HCC) 06/14/2021   Decreased white blood cell count, unspecified 06/14/2021   Edema, unspecified 06/14/2021   Low back pain, unspecified 06/14/2021   Other specified counseling 06/14/2021   Sebaceous cyst 06/14/2021   Varicose veins of unspecified lower extremity with inflammation 06/14/2021   Venous insufficiency (chronic) (peripheral) 06/14/2021   Elevated blood-pressure reading, without diagnosis of hypertension 10/17/2020   Tinea pedis, recurrent 12/24/2019   Onychomycosis of toenail 12/24/2019   Scoliosis of lumbar spine 12/24/2019    Degenerative joint disease (DJD) of lumbar spine 12/24/2019    Past Surgical History:  Procedure Laterality Date   EYE SURGERY     HERNIA REPAIR     UMBILICAL HERNIA REPAIR N/A 04/10/2022   Procedure: OPEN UMBILICAL HERNIA REPAIR WITH MESH;  Surgeon: Vernetta Berg, MD;  Location: [facility];  Service: General;  Laterality: N/A;       Home Medications    Prior to Admission medications   Medication Sig Start Date End Date Taking? Authorizing Provider  clindamycin (CLINDAGEL) 1 % gel Apply 1 Application topically 2 (two) times daily.    [provider]  diclofenac  (VOLTAREN ) 50 MG EC tablet Take 1 tablet (50 mg total) by mouth 2 (two) times daily. 01/25/24   [name], [name], NP  DULoxetine (CYMBALTA) 30 MG capsule Take 1 capsule by mouth daily. 10/27/20   [provider]  finasteride  (PROSCAR ) 5 MG tablet Take 1 tablet by mouth daily. 07/25/20   [provider]  Ipratropium-Albuterol (COMBIVENT) 20-100 MCG/ACT AERS respimat INHALE 1 PUFF BY MOUTH FOUR TIMES A DAY AS NEEDED - USE CONTINUOUSLY FOR 2 WEEKS THEN AS NEEDED FOR SHORTNESS OF BREATH 07/25/20   [provider]  lactobacillus acidophilus (BACID) TABS tablet Take 2 tablets by mouth 3 (three) times daily.    [provider]  lidocaine  (LIDODERM ) 5 % APPLY 1 PATCH TO SKIN ONCE DAILY (APPLY FOR 12 HOURS, THEN REMOVE FOR 12  HOURS) 09/14/20   [provider]  Lidocaine  4 % OINT Apply 1 application  topically 2 (two) times daily as needed. 01/25/24   [name], [name], NP  nystatin  cream (MYCOSTATIN ) Apply to affected area 2 times daily 04/04/24   [name], [name]  ondansetron  (ZOFRAN ) 4 MG tablet Take 1 tablet (4 mg total) by mouth every 6 (six) hours as needed for up to 20 doses for nausea or vomiting. Patient not taking: Reported on 04/04/2024 12/22/23   Rising, [name], [name]  pantoprazole (PROTONIX) 20 MG tablet Take 20 mg by mouth daily.    [provider]  POLYETHYLENE GLYCOL 3350 PO Take by mouth. 09/17/23   [provider]  tamsulosin  (FLOMAX ) 0.4 MG CAPS capsule Take 1 capsule by mouth at bedtime. 01/16/21   [provider]  Tiotropium Bromide Monohydrate 1.25 MCG/ACT AERS INHALE 2 PUFFS BY MOUTH ONCE A DAY 09/24/20   [provider]  tiZANidine  (ZANAFLEX ) 4 MG tablet Take 1-2 tablets (4-8 mg total) by mouth every 6 (six) hours as needed for muscle spasms. 12/24/19   Maranda Jamee Jacob, MD  traMADol  (ULTRAM ) 50 MG tablet Take 1 tablet (50 mg total) by mouth every 8 (eight) hours as needed for severe pain (pain score 7-10). Patient not taking: Reported on 04/04/2024 12/20/23   [name], [name]    Family History Family History  Problem Relation Age of Onset   Hypertension Mother     Social History Social History   Tobacco Use   Smoking status: Former    Types: Cigarettes    Passive exposure: Never   Smokeless tobacco: Never  Vaping Use   Vaping status: Never Used  Substance Use Topics   Alcohol use: No   Drug use: No     Allergies   Patient has no known allergies.   Review of Systems Review of Systems  Per HPI  Physical Exam Triage Vital Signs ED Triage Vitals  Encounter Vitals Group     BP 06/14/24 1753 127/81     Girls Systolic BP Percentile --      Girls Diastolic BP Percentile --      Boys Systolic BP Percentile --      Boys Diastolic BP Percentile --      Pulse Rate 06/14/24 1753 63     Resp 06/14/24 1753 17     Temp 06/14/24 1753 97.6 F (36.4 C)     Temp Source 06/14/24 1753 Oral     SpO2 06/14/24 1753 98 %     Weight --      Height --      Head Circumference --      Peak Flow --      Pain Score 06/14/24 1752 0     Pain Loc --      Pain Education --      Exclude from Growth Chart --    No data found.  Updated Vital Signs BP 127/81 (BP Location: Right Arm)   Pulse 63   Temp 97.6 F (36.4 C) (Oral)   Resp 17   SpO2 98%   Visual Acuity Right Eye  Distance:   Left Eye Distance:   Bilateral Distance:    Right Eye Near:   Left Eye Near:    Bilateral Near:     Physical Exam Vitals and nursing note reviewed.  Constitutional:      General: He is awake. He is not in acute distress.    Appearance: Normal  appearance. He is well-developed and well-groomed. He is not ill-appearing.  Skin:    General: Skin is warm and dry.         Comments: Area of mildly hypopigmented skin noted to central chest.  Neurological:     Mental Status: He is alert.  Psychiatric:        Behavior: Behavior is cooperative.      UC Treatments / Results  Labs (all labs ordered are listed, but only abnormal results are displayed) Labs Reviewed - No data to display  EKG   Radiology No results found.  Procedures Procedures (including critical care time)  Medications Ordered in UC Medications - No data to display  Initial Impression / Assessment and Plan / UC Course  I have reviewed the triage vital signs and the nursing notes.  Pertinent labs & imaging results that were available during my care of the patient were reviewed by me and considered in my medical decision making (see chart for details).     Patient is overall well-appearing.  Vitals are stable.  Mild hypopigmentation noted to central chest.  No other significant findings on exam.  Given dermatology follow-up.  Discussed follow-up and return precautions. Final Clinical Impressions(s) / UC Diagnoses   Final diagnoses:  Hypopigmentation     Discharge Instructions      As discussed recommend following up with dermatology for further evaluation of the hyperpigmentation to your chest.  There is no treatment that I can recommend at this time for this. Otherwise follow-up with your primary care provider or return here as needed.   ED Prescriptions   None    PDMP not reviewed this encounter.   Johnie Flaming A, NP 06/14/24 [phone]

## 2024-06-14 NOTE — Discharge Instructions (Signed)
 As discussed recommend following up with dermatology for further evaluation of the hyperpigmentation to your chest.  There is no treatment that I can recommend at this time for this. Otherwise follow-up with your primary care provider or return here as needed.

## 2024-07-04 ENCOUNTER — Encounter (HOSPITAL_COMMUNITY): Payer: Self-pay

## 2024-07-04 ENCOUNTER — Ambulatory Visit (HOSPITAL_COMMUNITY)
Admission: EM | Admit: 2024-07-04 | Discharge: 2024-07-04 | Disposition: A | Discharge status: Home / Self Care | Attending: Nurse Practitioner | Admitting: Nurse Practitioner

## 2024-07-04 DIAGNOSIS — M5441 Lumbago with sciatica, right side: Secondary | ICD-10-CM | POA: Diagnosis not present

## 2024-07-04 MED ORDER — METHYLPREDNISOLONE 4 MG PO TBPK: ORAL_TABLET | ORAL | 0 refills | Status: DC

## 2024-07-04 MED ORDER — GABAPENTIN 100 MG PO CAPS: ORAL_CAPSULE | ORAL | 0 refills | Status: DC

## 2024-07-04 NOTE — Discharge Instructions (Addendum)
 You were seen today for chronic sciatica in your right leg, which has been ongoing for over a year and has recently worsened. Your symptoms include numbness and electrical shock-like sensations in your right leg, along with lower back pain due to arthritis. You also report occasional weakness in your leg while walking and notice that sitting for long periods aggravates the pain.  To help manage your symptoms, I have prescribed a Medrol  dose pack to reduce inflammation and gabapentin  to help with nerve pain. Take the gabapentin  100 mg at bedtime for the next 5 days. After that, take it 100 mg twice daily for the next 5 days. You can continue taking it twice daily as needed for your sciatica pain.  It is important to follow up with your neurosurgeon for further evaluation and treatment. Please make sure to keep that appointment to explore other options for managing your sciatica. If you experience worsening pain, sudden weakness, numbness that spreads to other areas of your body, or difficulty walking, please go to the emergency room immediately. Additionally, if you develop new symptoms like severe back pain, loss of bowel or bladder control, or if you are unable to manage your symptoms with medication, contact your primary care provider for further advice.

## 2024-07-04 NOTE — ED Triage Notes (Signed)
 Patient reports sciatica pain in his right leg. This is a recurrent issue for this patient.

## 2024-07-04 NOTE — ED Provider Notes (Signed)
 [facility]    CSN: [id number] Arrival date & time: 07/04/24  1821      History   Chief Complaint Chief Complaint  Patient presents with   Leg Pain    HPI Jordan Nelson is a 73 y.o. male.   Discussed the use of AI scribe software for clinical note transcription with the patient, who gave verbal consent to proceed.   The patient presents with sciatica in the right leg, which has been ongoing for over a year but has worsened recently. The patient describes numbness and electrical-type shock sensations in the right leg, along with associated lower back pain related to arthritis. There has been no recent injury.  The patient reports that sitting for prolonged periods aggravates the symptoms. Occasionally, when walking, the legs give out. The patient has not tried any medications for symptom management.  The following sections of the patient's history were reviewed and updated as appropriate: allergies, current medications, past family history, past medical history, past social history, past surgical history, and problem list.        Past Medical History:  Diagnosis Date   Arthritis    Benign enlargement of prostate    Chronic back pain    Epigastric hernia    GERD (gastroesophageal reflux disease)    Numbness    right hand   Pollen allergy    Scoliosis     minor   Tuberculosis    early 1980's   Venous insufficiency (chronic) (peripheral)    Wears glasses     Patient Active Problem List   Diagnosis Date Noted   Gastroparesis    Incarcerated ventral hernia 04/10/2022   Anemia, unspecified 06/14/2021   Corn of foot 06/14/2021   Carpal tunnel syndrome 06/14/2021   Epistaxis 06/14/2021   Right sided sciatica 06/14/2021   Age-related nuclear cataract, bilateral 06/14/2021   Benign prostatic hyperplasia with lower urinary tract symptoms 06/14/2021   Centrilobular emphysema (HCC) 06/14/2021   Decreased white blood cell count, unspecified 06/14/2021    Edema, unspecified 06/14/2021   Low back pain, unspecified 06/14/2021   Other specified counseling 06/14/2021   Sebaceous cyst 06/14/2021   Varicose veins of unspecified lower extremity with inflammation 06/14/2021   Venous insufficiency (chronic) (peripheral) 06/14/2021   Elevated blood-pressure reading, without diagnosis of hypertension 10/17/2020   Tinea pedis, recurrent 12/24/2019   Onychomycosis of toenail 12/24/2019   Scoliosis of lumbar spine 12/24/2019   Degenerative joint disease (DJD) of lumbar spine 12/24/2019    Past Surgical History:  Procedure Laterality Date   EYE SURGERY     HERNIA REPAIR     UMBILICAL HERNIA REPAIR N/A 04/10/2022   Procedure: OPEN UMBILICAL HERNIA REPAIR WITH MESH;  Surgeon: Vernetta Berg, MD;  Location: [facility];  Service: General;  Laterality: N/A;       Home Medications    Prior to Admission medications   Medication Sig Start Date End Date Taking? Authorizing Provider  clindamycin (CLINDAGEL) 1 % gel Apply 1 Application topically 2 (two) times daily.   Yes [provider]  diclofenac  (VOLTAREN ) 50 MG EC tablet Take 1 tablet (50 mg total) by mouth 2 (two) times daily. 01/25/24  Yes [name], [name], NP  DULoxetine (CYMBALTA) 30 MG capsule Take 1 capsule by mouth daily. 10/27/20  Yes [provider]  finasteride  (PROSCAR ) 5 MG tablet Take 1 tablet by mouth daily. 07/25/20  Yes [provider]  gabapentin  (NEURONTIN ) 100 MG capsule Take 1 capsule (100 mg  total) by mouth at bedtime for 5 days, THEN 1 capsule (100 mg total) 2 (two) times daily for 5 days, THEN 1 capsule (100 mg total) 2 (two) times daily as needed for up to 10 days (sciatica nerve pain). 07/04/24 07/24/24 Yes Yacob Wilkerson, [name]  Ipratropium-Albuterol (COMBIVENT) 20-100 MCG/ACT AERS respimat INHALE 1 PUFF BY MOUTH FOUR TIMES A DAY AS NEEDED - USE CONTINUOUSLY FOR 2 WEEKS THEN AS NEEDED FOR SHORTNESS OF BREATH 07/25/20  Yes  [provider]  lidocaine  (LIDODERM ) 5 % APPLY 1 PATCH TO SKIN ONCE DAILY (APPLY FOR 12 HOURS, THEN REMOVE FOR 12 HOURS) 09/14/20  Yes [provider]  Lidocaine  4 % OINT Apply 1 application  topically 2 (two) times daily as needed. 01/25/24  Yes [name], [name], NP  methylPREDNISolone  (MEDROL  DOSEPAK) 4 MG TBPK tablet Take as directed 07/04/24  Yes [name], [name]  nystatin  cream (MYCOSTATIN ) Apply to affected area 2 times daily 04/04/24  Yes White, Elizabeth A, [name]  pantoprazole (PROTONIX) 20 MG tablet Take 20 mg by mouth daily.   Yes [provider]  POLYETHYLENE GLYCOL 3350 PO Take by mouth. 09/17/23  Yes [provider]  tamsulosin  (FLOMAX ) 0.4 MG CAPS capsule Take 1 capsule by mouth at bedtime. 01/16/21  Yes [provider]  traMADol  (ULTRAM ) 50 MG tablet Take 1 tablet (50 mg total) by mouth every 8 (eight) hours as needed for severe pain (pain score 7-10). 12/20/23  Yes [name], [name]  lactobacillus acidophilus (BACID) TABS tablet Take 2 tablets by mouth 3 (three) times daily.    [provider]  ondansetron  (ZOFRAN ) 4 MG tablet Take 1 tablet (4 mg total) by mouth every 6 (six) hours as needed for up to 20 doses for nausea or vomiting. Patient not taking: Reported on 04/04/2024 12/22/23   Rising, [name], [name]  Tiotropium Bromide Monohydrate 1.25 MCG/ACT AERS INHALE 2 PUFFS BY MOUTH ONCE A DAY 09/24/20   [provider]  tiZANidine  (ZANAFLEX ) 4 MG tablet Take 1-2 tablets (4-8 mg total) by mouth every 6 (six) hours as needed for muscle spasms. 12/24/19   Maranda Jamee Jacob, MD    Family History Family History  Problem Relation Age of Onset   Hypertension Mother     Social History Social History   Tobacco Use   Smoking status: Former    Types: Cigarettes    Passive exposure: Never   Smokeless tobacco: Never  Vaping Use   Vaping status: Never Used  Substance Use Topics   Alcohol use: No   Drug use: No      Allergies   Patient has no known allergies.   Review of Systems Review of Systems  Musculoskeletal:  Positive for back pain (history of arthritis within lower back). Negative for gait problem.  Neurological:  Positive for numbness (right lower extremity). Negative for weakness.  All other systems reviewed and are negative.    Physical Exam Triage Vital Signs ED Triage Vitals  Encounter Vitals Group     BP 07/04/24 1906 133/72     Girls Systolic BP Percentile --      Girls Diastolic BP Percentile --      Boys Systolic BP Percentile --      Boys Diastolic BP Percentile --      Pulse Rate 07/04/24 1906 94     Resp 07/04/24 1906 18     Temp 07/04/24 1906 98 F (36.7 C)     Temp Source 07/04/24 1906 Oral  SpO2 07/04/24 1906 99 %     Weight --      Height --      Head Circumference --      Peak Flow --      Pain Score 07/04/24 1907 0     Pain Loc --      Pain Education --      Exclude from Growth Chart --    No data found.  Updated Vital Signs BP 133/72 (BP Location: Left Arm)   Pulse 94   Temp 98 F (36.7 C) (Oral)   Resp 18   SpO2 99%   Visual Acuity Right Eye Distance:   Left Eye Distance:   Bilateral Distance:    Right Eye Near:   Left Eye Near:    Bilateral Near:     Physical Exam Vitals reviewed.  Constitutional:      General: He is not in acute distress.    Appearance: Normal appearance. He is not ill-appearing, toxic-appearing or diaphoretic.  HENT:     Head: Normocephalic.     Mouth/Throat:     Mouth: Mucous membranes are moist.  Cardiovascular:     Rate and Rhythm: Normal rate and regular rhythm.  Pulmonary:     Effort: Pulmonary effort is normal.     Breath sounds: Normal breath sounds.  Abdominal:     Palpations: Abdomen is soft.     Tenderness: There is no right CVA tenderness or left CVA tenderness.  Musculoskeletal:        General: Normal range of motion.     Cervical back: Normal, normal range of motion and neck supple.      Thoracic back: Normal.     Lumbar back: Tenderness present. No swelling, deformity, lacerations or spasms. Normal range of motion. Negative right straight leg raise test and negative left straight leg raise test.  Skin:    General: Skin is warm and dry.  Neurological:     General: No focal deficit present.     Mental Status: He is alert and oriented to person, place, and time.     Cranial Nerves: Cranial nerves 2-12 are intact.     Sensory: Sensation is intact.     Motor: Motor function is intact. No weakness.     Coordination: Coordination is intact.     Gait: Gait is intact.  Psychiatric:        Mood and Affect: Mood normal.        Speech: Speech normal.        Behavior: Behavior normal. Behavior is cooperative.      UC Treatments / Results  Labs (all labs ordered are listed, but only abnormal results are displayed) Labs Reviewed - No data to display  EKG   Radiology No results found.  Procedures Procedures (including critical care time)  Medications Ordered in UC Medications - No data to display  Initial Impression / Assessment and Plan / UC Course  I have reviewed the triage vital signs and the nursing notes.  Pertinent labs & imaging results that were available during my care of the patient were reviewed by me and considered in my medical decision making (see chart for details).     The patient presents with chronic sciatica in the right leg, ongoing for over a year, with recent worsening of symptoms. The pain is described as numbness and electrical shock-like sensations, accompanied by lower back pain due to underlying arthritis. The patient also experiences occasional leg weakness while walking  and notes that prolonged sitting exacerbates the symptoms.  A Medrol  dose pack was prescribed for inflammation, along with gabapentin  100 mg at bedtime for five days, followed by 100 mg twice daily for five days, with the option to take it twice daily as needed  thereafter. The patient was advised to follow up with the neurosurgeon for further evaluation and treatment.  Today's evaluation has revealed no signs of a dangerous process. Discussed diagnosis with patient and/or guardian. Patient and/or guardian aware of their diagnosis, possible red flag symptoms to watch out for and need for close follow up. Patient and/or guardian understands verbal and written discharge instructions. Patient and/or guardian comfortable with plan and disposition.  Patient and/or guardian has a clear mental status at this time, good insight into illness (after discussion and teaching) and has clear judgment to make decisions regarding their care  Documentation was completed with the aid of voice recognition software. Transcription may contain typographical errors.  Final Clinical Impressions(s) / UC Diagnoses   Final diagnoses:  Acute right-sided low back pain with right-sided sciatica     Discharge Instructions      You were seen today for chronic sciatica in your right leg, which has been ongoing for over a year and has recently worsened. Your symptoms include numbness and electrical shock-like sensations in your right leg, along with lower back pain due to arthritis. You also report occasional weakness in your leg while walking and notice that sitting for long periods aggravates the pain.  To help manage your symptoms, I have prescribed a Medrol  dose pack to reduce inflammation and gabapentin  to help with nerve pain. Take the gabapentin  100 mg at bedtime for the next 5 days. After that, take it 100 mg twice daily for the next 5 days. You can continue taking it twice daily as needed for your sciatica pain.  It is important to follow up with your neurosurgeon for further evaluation and treatment. Please make sure to keep that appointment to explore other options for managing your sciatica. If you experience worsening pain, sudden weakness, numbness that spreads to other  areas of your body, or difficulty walking, please go to the emergency room immediately. Additionally, if you develop new symptoms like severe back pain, loss of bowel or bladder control, or if you are unable to manage your symptoms with medication, contact your primary care provider for further advice.     ED Prescriptions     Medication Sig Dispense Auth. Provider   methylPREDNISolone  (MEDROL  DOSEPAK) 4 MG TBPK tablet Take as directed 21 tablet Clorine Swing, Lucie, [name]   gabapentin  (NEURONTIN ) 100 MG capsule Take 1 capsule (100 mg total) by mouth at bedtime for 5 days, THEN 1 capsule (100 mg total) 2 (two) times daily for 5 days, THEN 1 capsule (100 mg total) 2 (two) times daily as needed for up to 10 days (sciatica nerve pain). 27 capsule Iola Lucie, [name]      PDMP not reviewed this encounter.   Iola Lucie, [place] 07/04/24 2040

## 2024-07-26 ENCOUNTER — Other Ambulatory Visit (HOSPITAL_COMMUNITY): Payer: Self-pay

## 2024-07-26 DIAGNOSIS — R42 Dizziness and giddiness: Secondary | ICD-10-CM

## 2024-07-26 DIAGNOSIS — R079 Chest pain, unspecified: Secondary | ICD-10-CM

## 2024-08-16 ENCOUNTER — Encounter (HOSPITAL_COMMUNITY): Payer: Self-pay

## 2024-08-16 ENCOUNTER — Ambulatory Visit (HOSPITAL_COMMUNITY)

## 2024-08-17 ENCOUNTER — Encounter (HOSPITAL_COMMUNITY): Payer: Self-pay

## 2024-08-17 ENCOUNTER — Ambulatory Visit (HOSPITAL_COMMUNITY)
Admission: EM | Admit: 2024-08-17 | Discharge: 2024-08-17 | Disposition: A | Discharge status: Home / Self Care | Attending: Physician Assistant | Admitting: Physician Assistant

## 2024-08-17 DIAGNOSIS — G8929 Other chronic pain: Secondary | ICD-10-CM

## 2024-08-17 DIAGNOSIS — R109 Unspecified abdominal pain: Secondary | ICD-10-CM

## 2024-08-17 DIAGNOSIS — N4889 Other specified disorders of penis: Secondary | ICD-10-CM | POA: Diagnosis not present

## 2024-08-17 DIAGNOSIS — M436 Torticollis: Secondary | ICD-10-CM | POA: Diagnosis not present

## 2024-08-17 MED ORDER — NYSTATIN 100000 UNIT/GM EX CREA: TOPICAL_CREAM | CUTANEOUS | 0 refills | Status: AC

## 2024-08-17 NOTE — ED Provider Notes (Signed)
 " [facility]    CSN: [id number] Arrival date & time: 08/17/24  1737      History   Chief Complaint Chief Complaint  Patient presents with   Abdominal Cramping   Torticollis   Cyst    HPI Jordan Nelson is a 74 y.o. male.   Patient has multiple complaints.  Patient reports that he has soreness in his neck that began a week ago.  Patient states that is getting better but he has soreness when he turns his neck side-to-side.  Patient states this has happened multiple times in the past.  Patient thinks it is because of the scoliosis to his spine.  Patient also complains of abdominal pain since 2024.  He has been seen by GI he reports he has had a normal endoscopy and colonoscopy.  His primary care provider told him to go to urgent care if his pain changes.  Patient reports his pain has not changed but he thought he should come and get evaluated.  Patient reports that he also has a cyst on the shaft of his penis he was seen here for it several months ago and given a cream.  Patient states that the cyst went away but it has come back again.  He is requesting a prescription for more of the cream.  The history is provided by the patient. No language interpreter was used.  Abdominal Cramping    Past Medical History:  Diagnosis Date   Arthritis    Benign enlargement of prostate    Chronic back pain    Epigastric hernia    GERD (gastroesophageal reflux disease)    Numbness    right hand   Pollen allergy    Scoliosis     minor   Tuberculosis    early 1980's   Venous insufficiency (chronic) (peripheral)    Wears glasses     Patient Active Problem List   Diagnosis Date Noted   Gastroparesis    Incarcerated ventral hernia 04/10/2022   Anemia, unspecified 06/14/2021   Corn of foot 06/14/2021   Carpal tunnel syndrome 06/14/2021   Epistaxis 06/14/2021   Right sided sciatica 06/14/2021   Age-related nuclear cataract, bilateral 06/14/2021   Benign prostatic hyperplasia  with lower urinary tract symptoms 06/14/2021   Centrilobular emphysema (HCC) 06/14/2021   Decreased white blood cell count, unspecified 06/14/2021   Edema, unspecified 06/14/2021   Low back pain, unspecified 06/14/2021   Other specified counseling 06/14/2021   Sebaceous cyst 06/14/2021   Varicose veins of unspecified lower extremity with inflammation 06/14/2021   Venous insufficiency (chronic) (peripheral) 06/14/2021   Elevated blood-pressure reading, without diagnosis of hypertension 10/17/2020   Tinea pedis, recurrent 12/24/2019   Onychomycosis of toenail 12/24/2019   Scoliosis of lumbar spine 12/24/2019   Degenerative joint disease (DJD) of lumbar spine 12/24/2019    Past Surgical History:  Procedure Laterality Date   EYE SURGERY     HERNIA REPAIR     UMBILICAL HERNIA REPAIR N/A 04/10/2022   Procedure: OPEN UMBILICAL HERNIA REPAIR WITH MESH;  Surgeon: Vernetta Berg, MD;  Location: [facility];  Service: General;  Laterality: N/A;       Home Medications    Prior to Admission medications  Medication Sig Start Date End Date Taking? Authorizing Provider  DULoxetine (CYMBALTA) 30 MG capsule Take 1 capsule by mouth daily. 10/27/20   [provider]  Ipratropium-Albuterol (COMBIVENT) 20-100 MCG/ACT AERS respimat INHALE 1 PUFF BY MOUTH FOUR TIMES A DAY AS  NEEDED - USE CONTINUOUSLY FOR 2 WEEKS THEN AS NEEDED FOR SHORTNESS OF BREATH 07/25/20   [provider]  lidocaine  (LIDODERM ) 5 % APPLY 1 PATCH TO SKIN ONCE DAILY (APPLY FOR 12 HOURS, THEN REMOVE FOR 12 HOURS) 09/14/20   [provider]  Lidocaine  4 % OINT Apply 1 application  topically 2 (two) times daily as needed. 01/25/24   [name], [name], NP  nystatin  cream (MYCOSTATIN ) Apply to affected area 2 times daily 08/17/24   Barbera Perritt K, [name]  pantoprazole (PROTONIX) 20 MG tablet Take 20 mg by mouth daily.    [provider]  POLYETHYLENE GLYCOL 3350 PO Take by mouth. 09/17/23    [provider]  tamsulosin  (FLOMAX ) 0.4 MG CAPS capsule Take 1 capsule by mouth at bedtime. 01/16/21   [provider]  Tiotropium Bromide Monohydrate 1.25 MCG/ACT AERS INHALE 2 PUFFS BY MOUTH ONCE A DAY 09/24/20   [provider]  tiZANidine  (ZANAFLEX ) 4 MG tablet Take 1-2 tablets (4-8 mg total) by mouth every 6 (six) hours as needed for muscle spasms. 12/24/19   Maranda Jamee Jacob, MD    Family History Family History  Problem Relation Age of Onset   Hypertension Mother     Social History Social History[1]   Allergies   Patient has no known allergies.   Review of Systems Review of Systems  All other systems reviewed and are negative.    Physical Exam Triage Vital Signs ED Triage Vitals  Encounter Vitals Group     BP 08/17/24 1909 122/76     Girls Systolic BP Percentile --      Girls Diastolic BP Percentile --      Boys Systolic BP Percentile --      Boys Diastolic BP Percentile --      Pulse Rate 08/17/24 1909 67     Resp 08/17/24 1909 16     Temp 08/17/24 1909 98 F (36.7 C)     Temp Source 08/17/24 1909 Oral     SpO2 08/17/24 1909 98 %     Weight --      Height --      Head Circumference --      Peak Flow --      Pain Score 08/17/24 1901 10     Pain Loc --      Pain Education --      Exclude from Growth Chart --    No data found.  Updated Vital Signs BP 122/76 (BP Location: Left Arm)   Pulse 67   Temp 98 F (36.7 C) (Oral)   Resp 16   SpO2 98%   Visual Acuity Right Eye Distance:   Left Eye Distance:   Bilateral Distance:    Right Eye Near:   Left Eye Near:    Bilateral Near:     Physical Exam Constitutional:      Appearance: Normal appearance.  HENT:     Head: Normocephalic and atraumatic.     Nose: Nose normal.     Mouth/Throat:     Mouth: Mucous membranes are moist.  Cardiovascular:     Rate and Rhythm: Normal rate.  Pulmonary:     Effort: Pulmonary effort is normal.  Musculoskeletal:     Comments: Full  range of motion cervical spine tender left trapezius muscle, full range of motion upper extremities neurosensory intact  Skin:    Comments: Small pimple dorsal shaft of penis near the glans.  No wound, no ulcer  Neurological:  General: No focal deficit present.     Mental Status: He is alert.  Psychiatric:        Mood and Affect: Mood normal.      UC Treatments / Results  Labs (all labs ordered are listed, but only abnormal results are displayed) Labs Reviewed - No data to display  EKG   Radiology No results found.  Procedures Procedures (including critical care time)  Medications Ordered in UC Medications - No data to display  Initial Impression / Assessment and Plan / UC Course  I have reviewed the triage vital signs and the nursing notes.  Pertinent labs & imaging results that were available during my care of the patient were reviewed by me and considered in my medical decision making (see chart for details).      Final Clinical Impressions(s) / UC Diagnoses   Final diagnoses:  Chronic abdominal pain  Penile cyst  Torticollis     Discharge Instructions      Follow up with your Physician for recheck of abdominal pain.  Have your urologist evaluate penile cyst.    ED Prescriptions     Medication Sig Dispense Auth. Provider   nystatin  cream (MYCOSTATIN ) Apply to affected area 2 times daily 30 g Lunabelle Oatley K, [name]      PDMP not reviewed this encounter. An After Visit Summary was printed and given to the patient.     [1]  Social History Tobacco Use   Smoking status: Former    Types: Cigarettes    Passive exposure: Never   Smokeless tobacco: Never  Vaping Use   Vaping status: Never Used  Substance Use Topics   Alcohol use: No   Drug use: No     Flint Sonny POUR, [name] 08/17/24 1942  "

## 2024-08-17 NOTE — ED Triage Notes (Signed)
 Pt has c/o left lower abdominal cramping , and tightness x 1 year pt states he was getting treated for it at GI specialist, but couldn't find anything wrong, and is still having the issue.  Pt  also has c/o cyst on shaft of penis x 4 days ago that was drained and came back, and stiffness in neck x 4 days. Pt denies taking medications at home for symptoms

## 2024-08-17 NOTE — Discharge Instructions (Addendum)
 Follow up with your Physician for recheck of abdominal pain.  Have your urologist evaluate penile cyst.

## 2024-09-01 ENCOUNTER — Encounter: Payer: Self-pay | Admitting: Neurosurgery

## 2024-09-01 ENCOUNTER — Other Ambulatory Visit: Payer: Self-pay | Admitting: Neurosurgery

## 2024-09-01 DIAGNOSIS — M5441 Lumbago with sciatica, right side: Secondary | ICD-10-CM

## 2024-09-29 ENCOUNTER — Other Ambulatory Visit
